# Patient Record
Sex: Female | Born: 1980
Health system: Southern US, Community
[De-identification: ages and names within clinical notes are randomized; demographics above are authoritative.]

## PROBLEM LIST (undated history)

## (undated) DIAGNOSIS — O24419 Gestational diabetes mellitus in pregnancy, unspecified control: Secondary | ICD-10-CM

## (undated) HISTORY — PX: APPENDECTOMY: SHX54

---

## 2007-01-01 ENCOUNTER — Inpatient Hospital Stay (HOSPITAL_COMMUNITY): Admission: EM | Admit: 2007-01-01 | Discharge: 2007-01-02 | Payer: Self-pay | Admitting: Emergency Medicine

## 2007-01-01 ENCOUNTER — Encounter (INDEPENDENT_AMBULATORY_CARE_PROVIDER_SITE_OTHER): Payer: Self-pay | Admitting: Surgery

## 2007-10-16 ENCOUNTER — Emergency Department (HOSPITAL_COMMUNITY): Admission: EM | Admit: 2007-10-16 | Discharge: 2007-10-16 | Payer: Self-pay | Admitting: Emergency Medicine

## 2009-04-18 IMAGING — US US OB LIMITED
1 series · 13 of 28 positions shown · non-contrast
Comparison: None.

CLINICAL DATA: 27-year-old female with abdominal pain and vaginal
bleeding.  Gestational age by last menstrual period is 6 weeks and
4 days.

OBSTETRIC <14 WK ULTRASOUND
TECHNIQUE: Transabdominal and transvaginal ultrasound was
performed for evaluation of the gestation as well as the maternal
uterus and adnexal regions.

[Series 1: unknown · 0.28mm/px · 13 of 33 slices shown]
[im 2/33]
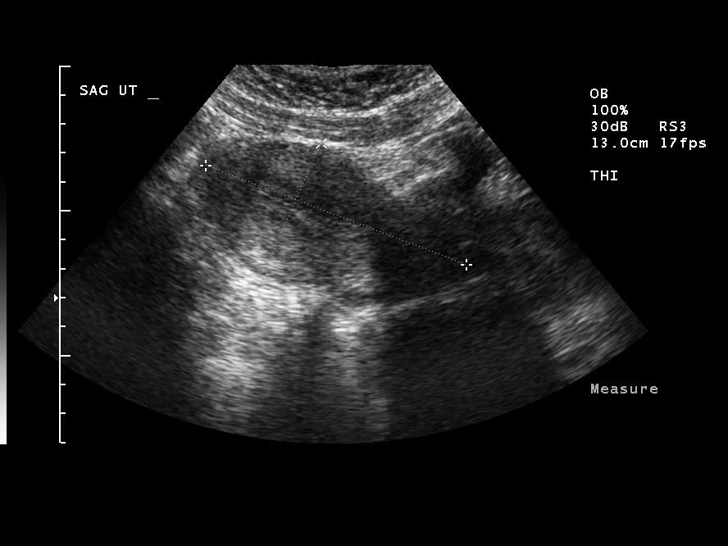
[im 4/33]
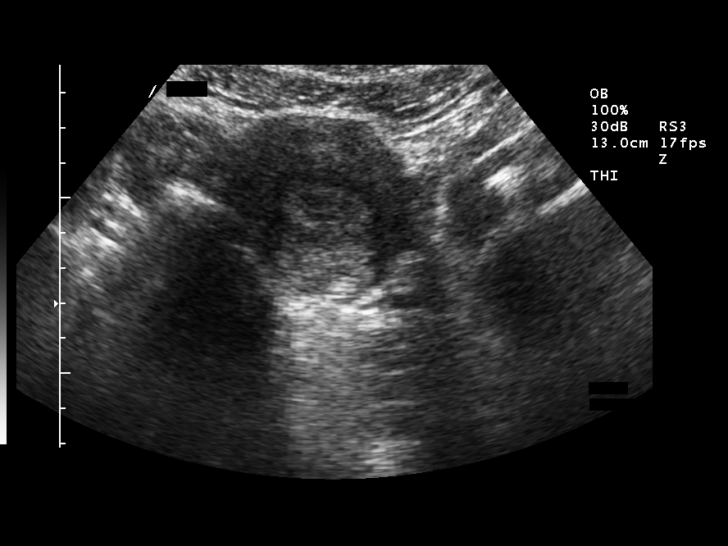
[im 6/33]
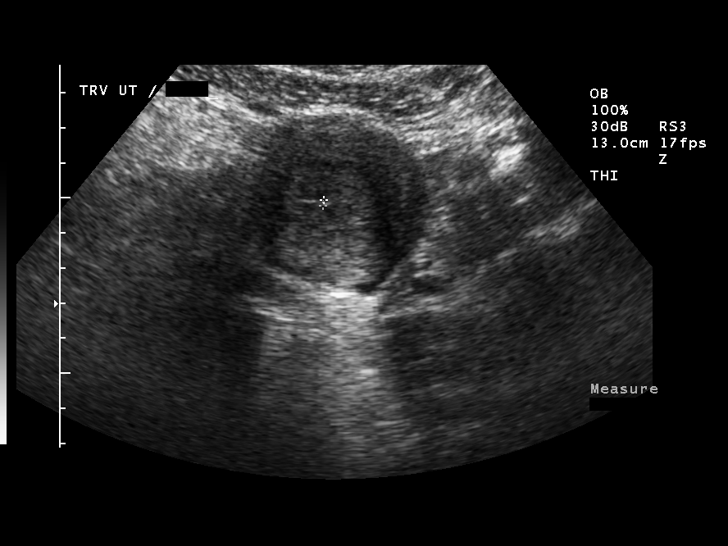
[im 9/33]
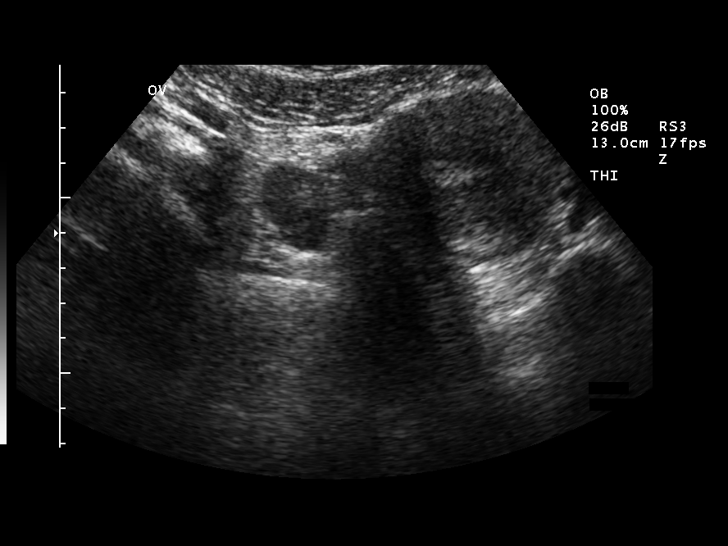
[im 11/33]
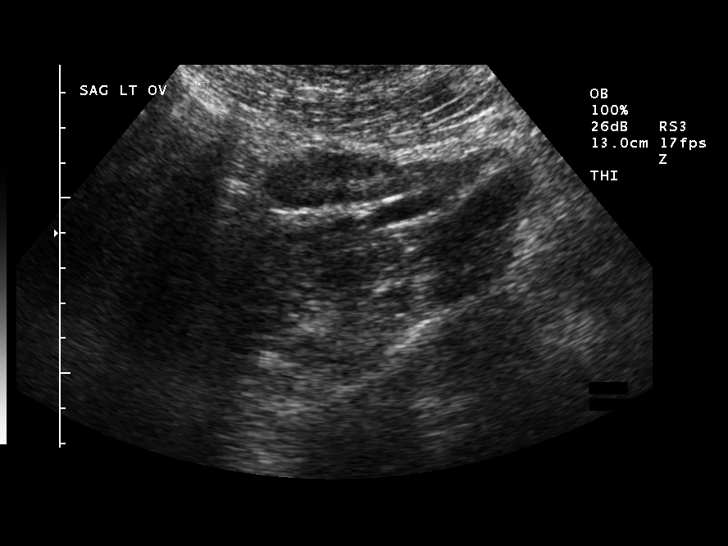
[im 14/33]
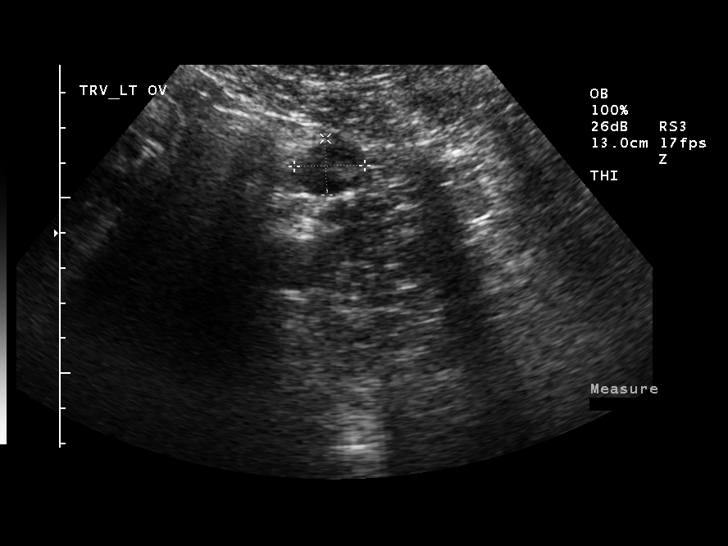
[im 17/33]
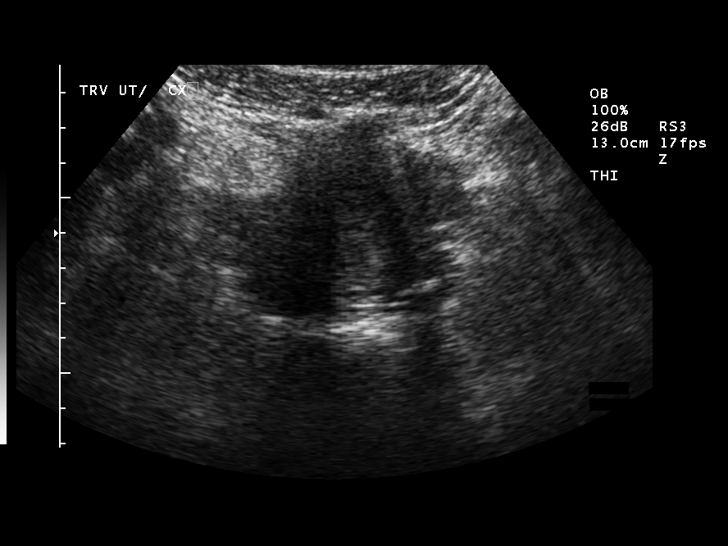
[im 19/33]
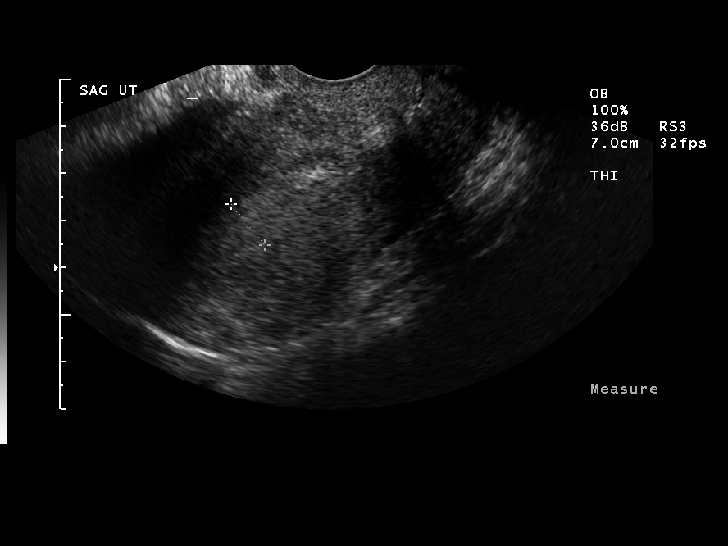
[im 22/33]
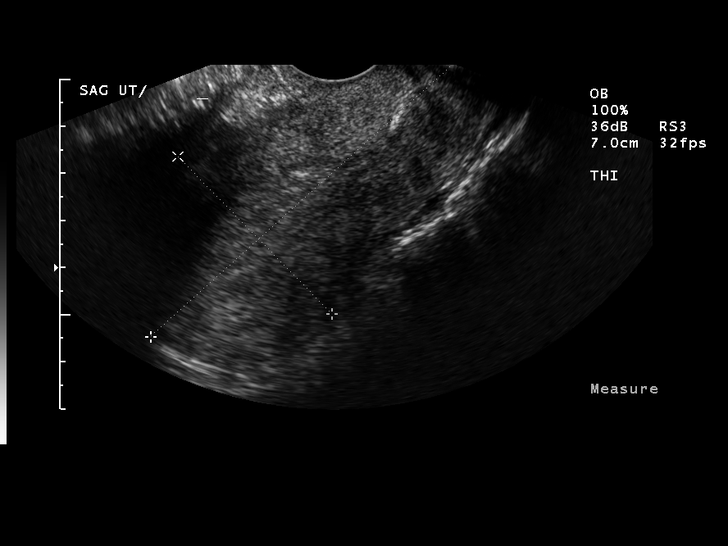
[im 24/33]
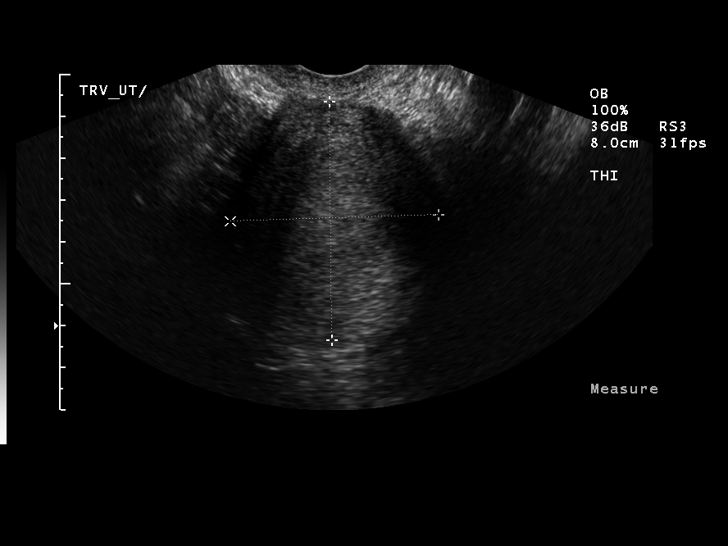
[im 27/33]
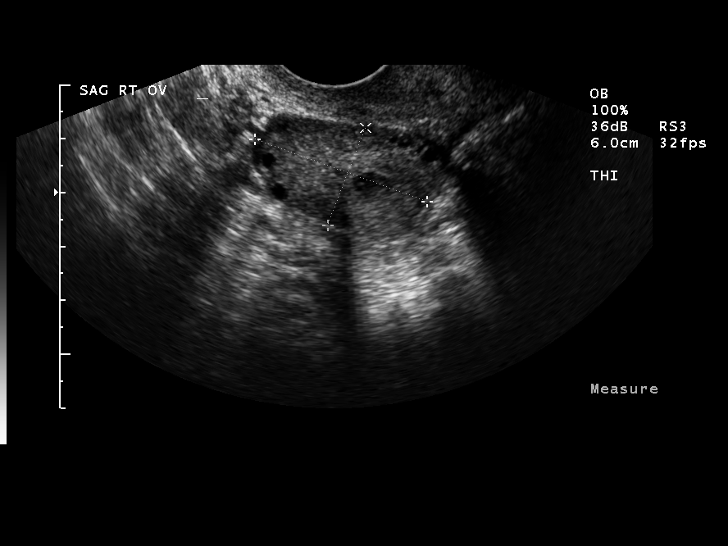
[im 29/33]
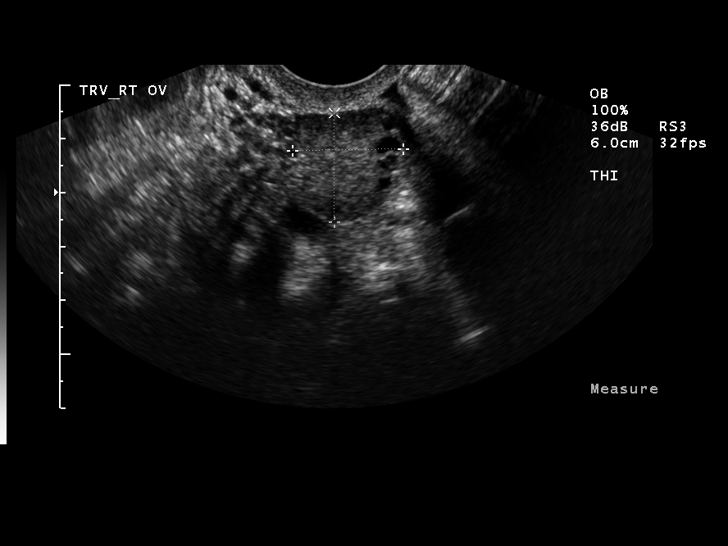
[im 31/33]
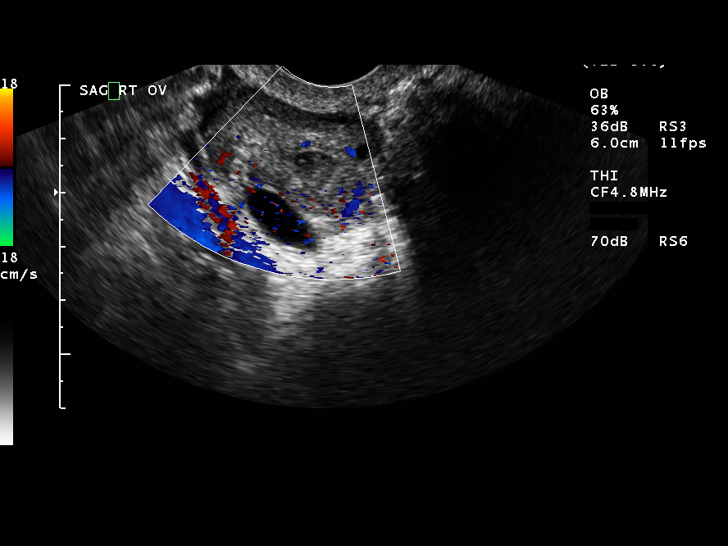

[13 of 28 positions shown; findings below may reference images not displayed]

Intrauterine gestational sac: None is seen.
Yolk sac: None is seen.
Embryo: None is seen.
Cardiac Activity: None is seen.

Maternal uterus/adnexae:
The uterus measures 9.6 x 5.2 x 5.1 cm.  The endometrial stripe
varies from 2.5 - 9 mm in the region of the fundus.  No
intrauterine fluid or gestational sac is identified.  No pelvic
free fluid is identified.  The right adnexa is normal measuring
x 2.1 x 2.0 cm.  The left adnexa was superiorly positioned, too
high for evaluation with transvaginal imaging.  On transabdominal
technique, the left ovary is normal measuring 4.2 by 2.0 x 0.6 cm
and appears within normal limits.  No adnexal mass is identified.
IMPRESSION: 1.  No intrauterine gestation identified.  No suspicious adnexal
mass or pelvic free fluid.
2.  Differential considerations include spontaneous completed
abortion versus occult ectopic pregnancy.   Recommend correlation
with serial quantitative BHCG and followup imaging as indicated.

## 2010-01-26 ENCOUNTER — Ambulatory Visit (HOSPITAL_COMMUNITY)
Admission: RE | Admit: 2010-01-26 | Discharge: 2010-01-26 | Payer: Self-pay | Source: Home / Self Care | Admitting: Family Medicine

## 2010-03-30 ENCOUNTER — Ambulatory Visit (HOSPITAL_COMMUNITY)
Admission: RE | Admit: 2010-03-30 | Discharge: 2010-03-30 | Payer: Self-pay | Source: Home / Self Care | Admitting: Family Medicine

## 2010-06-10 ENCOUNTER — Inpatient Hospital Stay (HOSPITAL_COMMUNITY)
Admission: AD | Admit: 2010-06-10 | Discharge: 2010-06-12 | Payer: Self-pay | Source: Home / Self Care | Attending: Obstetrics and Gynecology | Admitting: Obstetrics and Gynecology

## 2010-09-15 LAB — CBC
HCT: 27.1 % — ABNORMAL LOW (ref 36.0–46.0)
Hemoglobin: 9.4 g/dL — ABNORMAL LOW (ref 12.0–15.0)
MCH: 31.4 pg (ref 26.0–34.0)
MCHC: 33.5 g/dL (ref 30.0–36.0)
MCHC: 34.7 g/dL (ref 30.0–36.0)
MCV: 90.5 fL (ref 78.0–100.0)
Platelets: 162 K/uL (ref 150–400)
RBC: 2.99 MIL/uL — ABNORMAL LOW (ref 3.87–5.11)
RBC: 4.45 MIL/uL (ref 3.87–5.11)
RDW: 14.7 % (ref 11.5–15.5)
RDW: 15 % (ref 11.5–15.5)
WBC: 14.5 K/uL — ABNORMAL HIGH (ref 4.0–10.5)

## 2010-09-15 LAB — SYPHILIS: RPR W/REFLEX TO RPR TITER AND TREPONEMAL ANTIBODIES, TRADITIONAL SCREENING AND DIAGNOSIS ALGORITHM: RPR Ser Ql: NONREACTIVE

## 2010-11-17 NOTE — Op Note (Signed)
NAMEAviva Thomas            ACCOUNT NO.:  [id number]   MEDICAL RECORD NO.:  [id number]          PATIENT TYPE:  INP   LOCATION:  0101                         FACILITY:  [facility]   PHYSICIAN:  Sandria Bales. Ezzard Standing, M.D.  DATE OF BIRTH:  July 24, 1980   DATE OF PROCEDURE:  01/01/2007  DATE OF DISCHARGE:                               OPERATIVE REPORT   PREOPERATIVE DIAGNOSIS:  Acute appendicitis.   POSTOPERATIVE DIAGNOSIS:  Acute purulent appendicitis.   OPERATION PERFORMED:  Laparoscopic appendectomy.   SURGEON:  Sandria Bales. Ezzard Standing, M.D.   ASSISTANT:  None.   ANESTHESIA:  General endotracheal.   ESTIMATED BLOOD LOSS:  Minimal.   INDICATIONS FOR PROCEDURE:  [name] is a 30 year old Hispanic  female from [place] who comes with signs and symptoms of acute  appendicitis.  She speaks no [place], but through a friend who  translated, I described with her the indications, potential  complications of surgery.  Potential complications include but are not  limited to bleeding, infection, bowel injury and possibility of open  surgery.   DESCRIPTION OF PROCEDURE:  Time out was held to identify the patient and  procedure.  The patient was given 1 g of Cefoxitin at initiation of  procedure, had a Foley catheter in place.  Her abdomen was prepped with  Betadine solution and sterilely draped.   An infraumbilical incision was made with sharp dissection and carried  down to the abdominal cavity with 0 degree, 12 mm Hasson trocars  inserted into the abdominal cavity.  There was a 5 mm trocar placed in  the right upper quadrant, a 10 mm trocar placed in the left lower  quadrant.  An abdominal exploration revealed right and left lobes of the  liver unremarkable, gallbladder unremarkable, anterior wall of the  stomach unremarkable.  Attention was turned toward the pelvis, on the CT  scan there was a question of something along the right adnexa but I was  able to flip up her right ovary and  examine it.  She has a cyst of her  ovary which appeared benign and nothing in her adnexa or uterus on the  right side.  Her left side was unremarkable.  Her left ovary was  unremarkable.   I then identified her appendix which was posterior and medial to the  cecum and acutely inflamed with purulence.  I was able to take the  mesentery down with a Harmonic scalpel.  I used a vascular load of the  45 mm Ethicon Endo Gia stapler across the base of the appendix.  I then  delivered the appendix with an Endocatch bag and irrigated the abdomen  with about 500 mL of saline.  The staple line in the cecum looked good.   I then removed the trocars in turn. The umbilical port was closed with 0  Vicryl suture.  The skin at each port was closed with a 5-0 Monocryl  suture, painted with tincture of benzoin and steri-stripped.   The patient tolerated the procedure well, was transported to recovery  room in good condition.      Sandria Bales. Ezzard Standing,  M.D.  Electronically Signed     DHN/MEDQ  D:  01/01/2007  T:  01/02/2007  Job:  [id number]

## 2010-11-17 NOTE — H&P (Signed)
NAMEAviva Thomas            ACCOUNT NO.:  [id number]   MEDICAL RECORD NO.:  [id number]          PATIENT TYPE:  INP   LOCATION:  0101                         FACILITY:  [facility]   PHYSICIAN:  Sandria Bales. Ezzard Standing, M.D.  DATE OF BIRTH:  July 17, 1980   DATE OF ADMISSION:  01/01/2007  DATE OF DISCHARGE:                              HISTORY & PHYSICAL   HISTORY OF ILLNESS:  This is a 30 year old Hispanic female, who [place]ils  [place] who has been in this country for about 2 years,  developed abdominal pain over the last 24 hours which is localized to  the right lower quadrant and came to the [facility] Emergency Room  where she had been evaluated.   Dr. Linwood Dibbles saw her, obtained a CT scan which shows appendicitis and I  have been consulted for evaluation of this.   PAST MEDICAL HISTORY:  1. She denies any allergies.  2. She is not on any medicines.  3. She does not have a doctor she sees in the [place].  4. She has never been in the hospital.   REVIEW OF SYSTEMS:  PULMONARY:  Does not smoke cigarettes, no pneumonia  or tuberculosis.  CARDIAC:  No history of heart disease, chest pain or hypertension.  GASTROINTESTINAL:  No history of peptic ulcer disease or liver disease.  UROLOGIC:  No history of kidney stones or kidney infections.   SOCIAL HISTORY:  1. She is from [place]  2. She is not married but has 3 children who actually are living in [place].  3. She does not speak [place].  4. She has a friend in the room, whose name, unfortunately, I did not      get, who acted as her Nurse, learning disability during this interview.   PHYSICAL EXAM:  Her temperature is 98.9, pulse 83, blood pressure  112/73, respirations are 18.  She is a well-nourished, slightly  overweight, Hispanic female alert and cooperative on physical exam.  HEENT:  Unremarkable.  NECK:  Supple without mass, without thyromegaly.  LUNGS:  Clear to auscultation.  HEART:  A regular rate and rhythm  without murmur or rub.  ABDOMEN:  She has decreased bowel sounds.  She is tender with guarding  in the right lower quadrant.  She has no abdominal scars from prior  surgery.  EXTREMITIES:  She has good strength in all 4 extremities.  NEUROLOGICALLY:  Grossly intact.   Labs that I have show a urinalysis which is negative, a urine pregnancy  which is negative, a white blood count of 16,800, hemoglobin of 13.6,  hematocrit 39.2, platelet count 322,000.  Her sodium was 138, potassium  3.9, chloride of 103, CO2 of 27, glucose of 113.  Lipase is 19.   I reviewed her CT scan which shows an enlarged edematous appendix  consistent with appendicitis and she has a questionable right adnexal  mass which I think I will be able to see at the time of laparoscopy.  There was a suggestion of possibly repeating films.   Of note, I think the patient has no  insurance.  They were surprised  about the money this hospitalization and surgery would cost, but I  really think she has no options.  This is life-threatening and needs to  be done.   DIAGNOSIS and PLAN:  1. Appendicitis.  Plan Appendectomy.  I discussed the operation and      potential complications with the patient.  She has a friend who      speaks fluent [place] and acted as the Nurse, learning disability.  Plan for      surgery today.  2. Right adnexal mass.  I should be able to see this area at the time      of surgery.      Sandria Bales. Ezzard Standing, M.D.  Electronically Signed     DHN/MEDQ  D:  01/01/2007  T:  01/01/2007  Job:  [id number]

## 2011-03-30 LAB — BASIC METABOLIC PANEL
BUN: 13
CO2: 27
Chloride: 104
Creatinine, Ser: 0.82
GFR calc Af Amer: >60
Sodium: 139

## 2011-03-30 LAB — URINALYSIS, ROUTINE W REFLEX MICROSCOPIC
Bilirubin Urine: NEGATIVE
Nitrite: NEGATIVE
Protein, ur: 100 — AB
pH: 7

## 2011-03-30 LAB — CBC
HCT: 37
MCV: 81.9

## 2011-03-30 LAB — URINE MICROSCOPIC-ADD ON

## 2011-03-30 LAB — HCG, QUANTITATIVE, PREGNANCY: hCG, Beta Chain, Quant, S: 23 — ABNORMAL HIGH

## 2011-04-21 LAB — WET PREP, GENITAL
Clue Cells Wet Prep HPF POC: NONE SEEN
Trich, Wet Prep: NONE SEEN
Yeast Wet Prep HPF POC: NONE SEEN

## 2011-04-21 LAB — DIFFERENTIAL
Basophils Absolute: 0
Eosinophils Absolute: 0.4
Eosinophils Relative: 3
Monocytes Relative: 4

## 2011-04-21 LAB — GC/CHLAMYDIA PROBE AMP, GENITAL: GC Probe Amp, Genital: NEGATIVE

## 2011-04-21 LAB — URINALYSIS, ROUTINE W REFLEX MICROSCOPIC
Glucose, UA: NEGATIVE
Hgb urine dipstick: NEGATIVE
Nitrite: NEGATIVE
Specific Gravity, Urine: 1.014
pH: 7

## 2011-04-21 LAB — COMPREHENSIVE METABOLIC PANEL
ALT: 35
AST: 24
Albumin: 4
Alkaline Phosphatase: 56
BUN: 12
Potassium: 3.9

## 2011-04-21 LAB — CBC
HCT: 39.2
Hemoglobin: 13.6
MCV: 84.4
WBC: 16.8 — ABNORMAL HIGH

## 2011-04-21 LAB — LIPASE, BLOOD: Lipase: 19

## 2012-10-26 ENCOUNTER — Encounter (HOSPITAL_COMMUNITY): Payer: Self-pay | Admitting: Emergency Medicine

## 2012-10-26 DIAGNOSIS — Y92410 Unspecified street and highway as the place of occurrence of the external cause: Secondary | ICD-10-CM | POA: Insufficient documentation

## 2012-10-26 DIAGNOSIS — Y9389 Activity, other specified: Secondary | ICD-10-CM | POA: Insufficient documentation

## 2012-10-26 DIAGNOSIS — S20219A Contusion of unspecified front wall of thorax, initial encounter: Secondary | ICD-10-CM | POA: Insufficient documentation
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
## 2012-10-27 ENCOUNTER — Emergency Department (HOSPITAL_COMMUNITY): Payer: No Typology Code available for payment source

## 2012-10-27 ENCOUNTER — Emergency Department (HOSPITAL_COMMUNITY)
Admission: EM | Admit: 2012-10-27 | Discharge: 2012-10-27 | Disposition: A | Discharge status: Home / Self Care | Payer: No Typology Code available for payment source | Attending: Emergency Medicine | Admitting: Emergency Medicine

## 2012-10-27 DIAGNOSIS — S20211A Contusion of right front wall of thorax, initial encounter: Secondary | ICD-10-CM

## 2012-10-27 MED ORDER — METHOCARBAMOL 500 MG PO TABS: 1000.0000 mg | ORAL_TABLET | Freq: Four times a day (QID) | ORAL | Status: DC

## 2012-10-27 MED ORDER — TRAMADOL HCL 50 MG PO TABS: 50.0000 mg | ORAL_TABLET | Freq: Four times a day (QID) | ORAL | Status: DC | PRN

## 2012-10-27 MED ORDER — IBUPROFEN 600 MG PO TABS: 600.0000 mg | ORAL_TABLET | Freq: Four times a day (QID) | ORAL | Status: DC | PRN

## 2012-10-27 MED ORDER — HYDROCODONE-ACETAMINOPHEN 5-325 MG PO TABS
1.0000 | ORAL_TABLET | Freq: Once | ORAL | Status: AC
  Administered 2012-10-27: 1 via ORAL
  Filled 2012-10-27: qty 1

## 2012-10-27 NOTE — ED Notes (Signed)
Pt discharged.Vital signs stable and GCS 15 
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
## 2012-10-29 NOTE — ED Provider Notes (Signed)
Medical screening examination/treatment/procedure(s) were performed by non-physician practitioner and as supervising physician I was immediately available for consultation/collaboration.   Roxane Puerto W Geoffrey Mankin, MD 10/29/12 0502 

## 2013-11-21 ENCOUNTER — Other Ambulatory Visit (HOSPITAL_COMMUNITY): Payer: Self-pay | Admitting: Physician Assistant

## 2013-11-21 DIAGNOSIS — O2630 Retained intrauterine contraceptive device in pregnancy, unspecified trimester: Secondary | ICD-10-CM

## 2013-11-21 DIAGNOSIS — O3680X0 Pregnancy with inconclusive fetal viability, not applicable or unspecified: Secondary | ICD-10-CM

## 2013-11-23 ENCOUNTER — Ambulatory Visit (HOSPITAL_COMMUNITY)
Admission: RE | Admit: 2013-11-23 | Discharge: 2013-11-23 | Disposition: A | Discharge status: Home / Self Care | Payer: Medicaid Other | Source: Ambulatory Visit | Attending: Physician Assistant | Admitting: Physician Assistant

## 2013-11-23 DIAGNOSIS — Z3689 Encounter for other specified antenatal screening: Secondary | ICD-10-CM | POA: Insufficient documentation

## 2013-11-23 DIAGNOSIS — O2630 Retained intrauterine contraceptive device in pregnancy, unspecified trimester: Secondary | ICD-10-CM

## 2013-11-23 DIAGNOSIS — Z30431 Encounter for routine checking of intrauterine contraceptive device: Secondary | ICD-10-CM | POA: Insufficient documentation

## 2013-11-23 DIAGNOSIS — O3680X0 Pregnancy with inconclusive fetal viability, not applicable or unspecified: Secondary | ICD-10-CM

## 2014-01-14 ENCOUNTER — Other Ambulatory Visit (HOSPITAL_COMMUNITY): Payer: Self-pay | Admitting: Physician Assistant

## 2014-01-14 DIAGNOSIS — Z3689 Encounter for other specified antenatal screening: Secondary | ICD-10-CM

## 2014-01-14 LAB — OB RESULTS CONSOLE ANTIBODY SCREEN: Antibody Screen: NEGATIVE

## 2014-01-14 LAB — OB RESULTS CONSOLE RPR: RPR: NONREACTIVE

## 2014-01-14 LAB — OB RESULTS CONSOLE GC/CHLAMYDIA
CHLAMYDIA, DNA PROBE: NEGATIVE
Gonorrhea: NEGATIVE

## 2014-01-14 LAB — OB RESULTS CONSOLE ABO/RH: RH TYPE: POSITIVE

## 2014-01-14 LAB — OB RESULTS CONSOLE HEPATITIS B SURFACE ANTIGEN: Hepatitis B Surface Ag: NEGATIVE

## 2014-01-14 LAB — OB RESULTS CONSOLE RUBELLA ANTIBODY, IGM: RUBELLA: IMMUNE

## 2014-01-14 LAB — OB RESULTS CONSOLE HIV ANTIBODY (ROUTINE TESTING): HIV: NONREACTIVE

## 2014-02-26 ENCOUNTER — Other Ambulatory Visit (HOSPITAL_COMMUNITY): Payer: Self-pay | Admitting: Physician Assistant

## 2014-02-26 ENCOUNTER — Ambulatory Visit (HOSPITAL_COMMUNITY)
Admission: RE | Admit: 2014-02-26 | Discharge: 2014-02-26 | Disposition: A | Discharge status: Home / Self Care | Payer: Medicaid Other | Source: Ambulatory Visit | Attending: Physician Assistant | Admitting: Physician Assistant

## 2014-02-26 DIAGNOSIS — Z363 Encounter for antenatal screening for malformations: Secondary | ICD-10-CM

## 2014-02-26 DIAGNOSIS — Z3689 Encounter for other specified antenatal screening: Secondary | ICD-10-CM

## 2014-02-26 DIAGNOSIS — O4410 Placenta previa with hemorrhage, unspecified trimester: Secondary | ICD-10-CM | POA: Insufficient documentation

## 2014-02-26 DIAGNOSIS — Z1389 Encounter for screening for other disorder: Secondary | ICD-10-CM | POA: Insufficient documentation

## 2014-02-26 DIAGNOSIS — O4412 Placenta previa with hemorrhage, second trimester: Secondary | ICD-10-CM

## 2014-03-01 ENCOUNTER — Other Ambulatory Visit (HOSPITAL_COMMUNITY): Payer: Self-pay | Admitting: Physician Assistant

## 2014-03-01 DIAGNOSIS — Z0372 Encounter for suspected placental problem ruled out: Secondary | ICD-10-CM

## 2014-04-24 ENCOUNTER — Ambulatory Visit (HOSPITAL_COMMUNITY)
Admission: RE | Admit: 2014-04-24 | Discharge: 2014-04-24 | Disposition: A | Discharge status: Home / Self Care | Payer: No Typology Code available for payment source | Source: Ambulatory Visit | Attending: Physician Assistant | Admitting: Physician Assistant

## 2014-04-24 DIAGNOSIS — O4403 Placenta previa specified as without hemorrhage, third trimester: Secondary | ICD-10-CM | POA: Insufficient documentation

## 2014-04-24 DIAGNOSIS — Z0372 Encounter for suspected placental problem ruled out: Secondary | ICD-10-CM

## 2014-04-24 DIAGNOSIS — O4440 Low lying placenta NOS or without hemorrhage, unspecified trimester: Secondary | ICD-10-CM

## 2014-04-24 DIAGNOSIS — Z3A28 28 weeks gestation of pregnancy: Secondary | ICD-10-CM | POA: Insufficient documentation

## 2014-06-24 LAB — OB RESULTS CONSOLE GBS: STREP GROUP B AG: NEGATIVE

## 2014-07-05 NOTE — L&D Delivery Note (Signed)
Mother tolerated delivery well. Has hx of previous hemorrhage. Patient underwent involuntary pushing throughout, which sped up laboring. Baby girl delivered. Shoulders tight through delivery. No complications. Patient given 0.2mg  methergine due to hx. No tears.  Delivery Note At 10:09 AM a viable female was delivered via Vaginal, Spontaneous Delivery (Presentation: ; Occiput Anterior).  APGAR: 8, 9; weight  .   Placenta status: Intact, Spontaneous.  Cord: 3 vessels with the following complications: None.  Cord pH: pending  Anesthesia: None  Episiotomy: None Lacerations: None Suture Repair: n/a Est. Blood Loss (mL): 400  Mom to postpartum.  Baby to Couplet care / Skin to Skin.  Kathee DeltonMcKeag, Jarah Pember D 07/15/2014, 11:06 AM

## 2014-07-15 ENCOUNTER — Inpatient Hospital Stay (HOSPITAL_COMMUNITY)
Admission: AD | Admit: 2014-07-15 | Discharge: 2014-07-18 | DRG: 774 | Disposition: A | Discharge status: Home / Self Care | Payer: Medicaid Other | Source: Ambulatory Visit | Attending: Family Medicine | Admitting: Family Medicine

## 2014-07-15 ENCOUNTER — Encounter (HOSPITAL_COMMUNITY): Payer: Self-pay | Admitting: Emergency Medicine

## 2014-07-15 DIAGNOSIS — D649 Anemia, unspecified: Secondary | ICD-10-CM | POA: Diagnosis not present

## 2014-07-15 DIAGNOSIS — Z8632 Personal history of gestational diabetes: Secondary | ICD-10-CM

## 2014-07-15 DIAGNOSIS — Z3A39 39 weeks gestation of pregnancy: Secondary | ICD-10-CM | POA: Diagnosis present

## 2014-07-15 DIAGNOSIS — R509 Fever, unspecified: Secondary | ICD-10-CM

## 2014-07-15 DIAGNOSIS — N719 Inflammatory disease of uterus, unspecified: Secondary | ICD-10-CM

## 2014-07-15 DIAGNOSIS — O9902 Anemia complicating childbirth: Secondary | ICD-10-CM | POA: Diagnosis not present

## 2014-07-15 DIAGNOSIS — IMO0001 Reserved for inherently not codable concepts without codable children: Secondary | ICD-10-CM

## 2014-07-15 HISTORY — DX: Gestational diabetes mellitus in pregnancy, unspecified control: O24.419

## 2014-07-15 LAB — CBC
HEMATOCRIT: 36 % (ref 36.0–46.0)
Hemoglobin: 12.1 g/dL (ref 12.0–15.0)
MCH: 28.5 pg (ref 26.0–34.0)
MCHC: 33.6 g/dL (ref 30.0–36.0)
MCV: 84.7 fL (ref 78.0–100.0)
Platelets: 208 10*3/uL (ref 150–400)
RBC: 4.25 MIL/uL (ref 3.87–5.11)
RDW: 15.5 % (ref 11.5–15.5)
WBC: 14 10*3/uL — ABNORMAL HIGH (ref 4.0–10.5)

## 2014-07-15 LAB — ABO/RH: ABO/RH(D): O POS

## 2014-07-15 LAB — GLUCOSE, RANDOM: GLUCOSE: 87 mg/dL (ref 70–99)

## 2014-07-15 MED ORDER — METHYLERGONOVINE MALEATE 0.2 MG/ML IJ SOLN
0.2000 mg | INTRAMUSCULAR | Status: DC | PRN
  Administered 2014-07-15: 0.2 mg via INTRAMUSCULAR

## 2014-07-15 MED ORDER — METHYLERGONOVINE MALEATE 0.2 MG/ML IJ SOLN
INTRAMUSCULAR | Status: AC
  Administered 2014-07-15: 0.2 mg via INTRAMUSCULAR
  Filled 2014-07-15: qty 1

## 2014-07-15 MED ORDER — OXYTOCIN 40 UNITS IN LACTATED RINGERS INFUSION - SIMPLE MED: 62.5000 mL/h | INTRAVENOUS | Status: DC | PRN

## 2014-07-15 MED ORDER — LACTATED RINGERS IV SOLN
INTRAVENOUS | Status: DC
  Administered 2014-07-17 (×2): via INTRAVENOUS
  Administered 2014-07-18: 125 mL/h via INTRAVENOUS

## 2014-07-15 MED ORDER — SIMETHICONE 80 MG PO CHEW: 80.0000 mg | CHEWABLE_TABLET | ORAL | Status: DC | PRN

## 2014-07-15 MED ORDER — PRENATAL MULTIVITAMIN CH
1.0000 | ORAL_TABLET | Freq: Every day | ORAL | Status: DC
  Administered 2014-07-16 – 2014-07-18 (×3): 1 via ORAL
  Filled 2014-07-15 (×3): qty 1

## 2014-07-15 MED ORDER — METHYLERGONOVINE MALEATE 0.2 MG PO TABS: 0.2000 mg | ORAL_TABLET | ORAL | Status: DC | PRN

## 2014-07-15 MED ORDER — ONDANSETRON HCL 4 MG/2ML IJ SOLN: 4.0000 mg | Freq: Four times a day (QID) | INTRAMUSCULAR | Status: DC | PRN

## 2014-07-15 MED ORDER — ONDANSETRON HCL 4 MG PO TABS
4.0000 mg | ORAL_TABLET | ORAL | Status: DC | PRN
Start: 2014-07-15 — End: 2014-07-18

## 2014-07-15 MED ORDER — LACTATED RINGERS IV BOLUS (SEPSIS)
1000.0000 mL | Freq: Once | INTRAVENOUS | Status: AC
  Administered 2014-07-15: 1000 mL via INTRAVENOUS

## 2014-07-15 MED ORDER — LANOLIN HYDROUS EX OINT: TOPICAL_OINTMENT | CUTANEOUS | Status: DC | PRN

## 2014-07-15 MED ORDER — OXYTOCIN BOLUS FROM INFUSION
500.0000 mL | INTRAVENOUS | Status: DC
  Administered 2014-07-15: 500 mL via INTRAVENOUS

## 2014-07-15 MED ORDER — LACTATED RINGERS IV SOLN
500.0000 mL | INTRAVENOUS | Status: DC | PRN
  Administered 2014-07-16: 1000 mL via INTRAVENOUS

## 2014-07-15 MED ORDER — DIBUCAINE 1 % RE OINT: 1.0000 "application " | TOPICAL_OINTMENT | RECTAL | Status: DC | PRN

## 2014-07-15 MED ORDER — OXYCODONE-ACETAMINOPHEN 5-325 MG PO TABS
2.0000 | ORAL_TABLET | ORAL | Status: DC | PRN
  Administered 2014-07-16 (×2): 2 via ORAL
  Administered 2014-07-18: 1 via ORAL
  Filled 2014-07-15 (×2): qty 2

## 2014-07-15 MED ORDER — OXYCODONE-ACETAMINOPHEN 5-325 MG PO TABS
1.0000 | ORAL_TABLET | ORAL | Status: DC | PRN
  Administered 2014-07-15 – 2014-07-17 (×5): 1 via ORAL
  Filled 2014-07-15 (×6): qty 1

## 2014-07-15 MED ORDER — WITCH HAZEL-GLYCERIN EX PADS: 1.0000 "application " | MEDICATED_PAD | CUTANEOUS | Status: DC | PRN

## 2014-07-15 MED ORDER — ONDANSETRON HCL 4 MG/2ML IJ SOLN: 4.0000 mg | INTRAMUSCULAR | Status: DC | PRN

## 2014-07-15 MED ORDER — BENZOCAINE-MENTHOL 20-0.5 % EX AERO
1.0000 | INHALATION_SPRAY | CUTANEOUS | Status: DC | PRN
Start: 2014-07-15 — End: 2014-07-18
  Filled 2014-07-15: qty 56

## 2014-07-15 MED ORDER — FENTANYL CITRATE 0.05 MG/ML IJ SOLN
100.0000 ug | INTRAMUSCULAR | Status: DC | PRN
  Administered 2014-07-15 (×4): 100 ug via INTRAVENOUS
  Filled 2014-07-15 (×4): qty 2

## 2014-07-15 MED ORDER — ACETAMINOPHEN 325 MG PO TABS
650.0000 mg | ORAL_TABLET | ORAL | Status: DC | PRN
  Administered 2014-07-17 (×2): 650 mg via ORAL
  Filled 2014-07-15 (×2): qty 2

## 2014-07-15 MED ORDER — CITRIC ACID-SODIUM CITRATE 334-500 MG/5ML PO SOLN: 30.0000 mL | ORAL | Status: DC | PRN

## 2014-07-15 MED ORDER — FLEET ENEMA 7-19 GM/118ML RE ENEM: 1.0000 | ENEMA | RECTAL | Status: DC | PRN

## 2014-07-15 MED ORDER — IBUPROFEN 600 MG PO TABS
600.0000 mg | ORAL_TABLET | Freq: Four times a day (QID) | ORAL | Status: DC
  Administered 2014-07-15 – 2014-07-18 (×13): 600 mg via ORAL
  Filled 2014-07-15 (×12): qty 1

## 2014-07-15 MED ORDER — LIDOCAINE HCL (PF) 1 % IJ SOLN
30.0000 mL | INTRAMUSCULAR | Status: DC | PRN
  Filled 2014-07-15: qty 30

## 2014-07-15 MED ORDER — TETANUS-DIPHTH-ACELL PERTUSSIS 5-2.5-18.5 LF-MCG/0.5 IM SUSP: 0.5000 mL | Freq: Once | INTRAMUSCULAR | Status: DC

## 2014-07-15 MED ORDER — OXYTOCIN 40 UNITS IN LACTATED RINGERS INFUSION - SIMPLE MED
62.5000 mL/h | INTRAVENOUS | Status: DC
  Filled 2014-07-15: qty 1000

## 2014-07-15 MED ORDER — DIPHENHYDRAMINE HCL 25 MG PO CAPS: 25.0000 mg | ORAL_CAPSULE | Freq: Four times a day (QID) | ORAL | Status: DC | PRN

## 2014-07-15 MED ORDER — SENNOSIDES-DOCUSATE SODIUM 8.6-50 MG PO TABS
2.0000 | ORAL_TABLET | ORAL | Status: DC
  Administered 2014-07-15 – 2014-07-17 (×3): 2 via ORAL
  Filled 2014-07-15 (×3): qty 2

## 2014-07-15 MED ORDER — PHENYLEPHRINE 40 MCG/ML (10ML) SYRINGE FOR IV PUSH (FOR BLOOD PRESSURE SUPPORT)
PREFILLED_SYRINGE | INTRAVENOUS | Status: AC
  Filled 2014-07-15: qty 20

## 2014-07-15 NOTE — Lactation Note (Signed)
This note was copied from the chart of Betty Thomas. Lactation Consultation Note initial visit at 9 hours of age.  Family friend interpreting for mom when she doesn't understand some english.  Mom reports experience with 4 older children and breastfeeding, mom denies any problems.  Mom reports this baby is doing well.  Baby is now asleep swaddled in visitors arms.  Baby had a formula bottle of 5mls earlier today when mom wasn't feeling well.  Discussed benefits of exclusively breastfeeding.  Encouraged mom to try hand expression and spoon feeding before supplementing with formula. [facility] resources given and discussed.  Encouraged to feed with early cues on demand.  Early newborn behavior discussed.  Hand expression demonstrated by mom with colostrum visible.  Mom to call for assist as needed.      Patient Name: Betty Thomas ZOXWR'UToday's Date: 07/15/2014 Reason for consult: Initial assessment   Maternal Data Has patient been taught Hand Expression?: Yes Does the patient have breastfeeding experience prior to this delivery?: Yes  Feeding Feeding Type: Breast Fed  LATCH Score/Interventions                Intervention(s): Breastfeeding basics reviewed     Lactation Tools Discussed/Used     Consult Status Consult Status: Follow-up Date: 07/16/14 Follow-up type: In-patient    Jannifer RodneyShoptaw, Mikaylah Libbey Lynn 07/15/2014, 7:24 PM

## 2014-07-15 NOTE — Progress Notes (Signed)
Patient wanted to use her friend for Interpreting, as per Hospital regulations I was present during questions with Lillia AbedLindsay, RN  [facility] Interpreter.

## 2014-07-15 NOTE — H&P (Signed)
Betty Thomas is a 34 y.o. [id number] at 4271w5d presenting for spontaneous onset of labor.  She began having contractions every 2-3 minutes 2 hours prior to having her best [facility] 5cm. [facility] and found to be 6.5cm dilated. Reports loss of mucous plug but gross loss of fluid.   [facility] Pregnancy significant for failed 1hr GTT (152) with 3 hr (79, 163, 173, 139), obesity, hyperemesis. EFW 75th%ile and low lying placenta resolved on 28 wk U/S. Reports history of PPH after most recent delivery. Per EMR NSVD 06/10/2010 followed by 700cc EBL, given pitocin and cytotec. No transfusion given. She is willing to receive blood products if required.   Pt's friend was spanish interpretor to obtain history after patient declined hospital-provided interpretor.     History  Past Medical History  Diagnosis Date  . Postpartum hemorrhage 2011    G5  . Gestational diabetes     G6, diet controlled   Past Surgical History  Procedure Laterality Date  . Appendectomy     Family History: family history is not on file. Social History:  reports that she has never smoked. She has never used smokeless tobacco. She reports that she does not drink alcohol or use illicit drugs.  ROS In addition to HPI: Constitutional: Negative for fever and chills Respiratory: Negative for dyspnea Cardiovascular: Negative for chest pain or palpitations  Gastrointestinal: Negative for vomiting, diarrhea and constipation Genitourinary: Negative for dysuria and urgency Neurological: Negative for headaches and visual changes  Blood pressure 125/79, pulse 94, temperature 98.6 F (37 C), temperature source Oral, resp. rate 20, height 5\' 3"  (1.6 m), weight 81.647 kg (180 lb), SpO2 100 %. Exam Physical Exam  GENERAL: Well-appearing, well-nourished female in some discomfort with contractions. HEENT: Normocephalic, atraumatic HEART: Normal  rate RESP: Normal effort ABDOMEN: Soft, non-tender, gravid uterus consistent with dates. EXTREMITIES: No edema NEURO: alert and oriented, DTRs 2+  Dilation: 6.5 Effacement (%): 100 Station: -1 Presentation: Vertex Exam by:: Honeycutt, RN  FHT:  Baseline 130 , moderate variability, accelerations present, variable deceleration x1 Toco: Irregular q  2-4 min.   Prenatal labs: ABO, Rh: O/Positive/-- (07/13 0000) Antibody: Negative (07/13 0000) Rubella: Immune (07/13 0000) RPR: Nonreactive (07/13 0000)  HBsAg: Negative (07/13 0000)  HIV: Non-reactive (07/13 0000)  GBS: Negative (12/21 0000)   Assessment/Plan: Betty Thomas is a 34 y.o. G5P4004 at 7071w5d here for spontaneous onset of labor. #Labor: Progressing normally #Pain:  IV pain medications #FWB:  Category I #ID:   GBS neg #MOF:  Breast feeding #MOC: Undecided #Circ:  N/A #Borderline gestational diabetes: Check glucose; most recent EFW 75%ile #History of postpartum hemorrhage: Uterotonics to bedside at delivery, type and cross.   Hazeline JunkerGrunz, Ryan 07/15/2014, 6:04 AM

## 2014-07-15 NOTE — ED Notes (Signed)
Pt is having her baby. Pt is at term

## 2014-07-15 NOTE — Progress Notes (Signed)
Spanish interpreter at bedside.  Patient requests that her friend interpret; interpreter consent signed.

## 2014-07-15 NOTE — ED Notes (Signed)
Bed: WA01 Expected date:  Expected time:  Means of arrival:  Comments: 

## 2014-07-15 NOTE — ED Provider Notes (Signed)
CSN: [id number]     Arrival date & time 07/15/14  0413 History   First MD Initiated Contact with Patient 07/15/14 [phone]     Chief Complaint  Patient presents with  . Birthing      (Consider location/radiation/quality/duration/timing/severity/associated sxs/prior Treatment) HPI  Level 5 Caveat: urgent need for intervention. This is a 34 year old female gravida 5 para 4 who has been having contractions for about the past 3 hours. She reports being at term. The contractions are 1 minute apart and moderate in intensity. Her water has not broken. Her prenatal care is been to the health department. She does not have an OB/GYN.  History reviewed. No pertinent past medical history. History reviewed. No pertinent past surgical history. History reviewed. No pertinent family history. History  Substance Use Topics  . Smoking status: Never Smoker   . Smokeless tobacco: Not on file  . Alcohol Use: No   OB History    Gravida Para Term Preterm AB TAB SAB Ectopic Multiple Living   1              Review of Systems  Unable to perform ROS   Allergies  Review of patient's allergies indicates no known allergies.  Home Medications   Prior to Admission medications   Medication Sig Start Date End Date Taking? Authorizing Provider  ibuprofen (ADVIL,MOTRIN) 200 MG tablet Take 200 mg by mouth every 6 (six) hours as needed for pain.    Historical Provider, MD  ibuprofen (ADVIL,MOTRIN) 600 MG tablet Take 1 tablet (600 mg total) by mouth every 6 (six) hours as needed for pain. 10/27/12   Renne CriglerJoshua Geiple, PA-C  methocarbamol (ROBAXIN) 500 MG tablet Take 2 tablets (1,000 mg total) by mouth 4 (four) times daily. 10/27/12   Renne CriglerJoshua Geiple, PA-C  traMADol (ULTRAM) 50 MG tablet Take 1 tablet (50 mg total) by mouth every 6 (six) hours as needed for pain. 10/27/12   Renne CriglerJoshua Geiple, PA-C   There were no vitals taken for this visit.   Physical Exam  General: Well-developed, well-nourished female in no acute distress;  appearance consistent with age of record HENT: normocephalic; atraumatic Eyes: pupils equal, round and reactive to light; extraocular muscles intact Neck: supple Heart: regular rate and rhythm Lungs: clear to auscultation bilaterally Abdomen: Gravid, consistent with dates; diffuse tenderness Extremities: No deformity; full range of motion; pulses normal GU: Bloody show; cervical dilation about 5 centimeters with 100% effacement Neurologic: Awake, alert and oriented; motor function intact in all extremities and symmetric; no facial droop Skin: Warm and dry Psychiatric: Anxious    ED Course  Procedures (including critical care time)   MDM  4:22 AM Obstetric rapid response nurses at bedside. EMS at bedside ready to transport patient. Dr. Jerrol BananaAnwanyu accepting Ob/Gyn.   Final diagnoses:  Active labor     Hanley SeamenJohn L Eaven Schwager, MD 07/15/14 [phone]

## 2014-07-16 LAB — URINALYSIS, ROUTINE W REFLEX MICROSCOPIC
Bilirubin Urine: NEGATIVE
Glucose, UA: NEGATIVE mg/dL
KETONES UR: NEGATIVE mg/dL
NITRITE: NEGATIVE
PROTEIN: NEGATIVE mg/dL
Specific Gravity, Urine: <1.005 — ABNORMAL LOW (ref 1.005–1.030)
UROBILINOGEN UA: 0.2 mg/dL (ref 0.0–1.0)
pH: 5.5 (ref 5.0–8.0)

## 2014-07-16 LAB — CBC
HCT: 20.8 % — ABNORMAL LOW (ref 36.0–46.0)
HEMATOCRIT: 23 % — AB (ref 36.0–46.0)
Hemoglobin: 7.9 g/dL — ABNORMAL LOW (ref 12.0–15.0)
Hemoglobin: 7.3 g/dL — ABNORMAL LOW (ref 12.0–15.0)
MCH: 29.4 pg (ref 26.0–34.0)
MCH: 29.8 pg (ref 26.0–34.0)
MCHC: 34.3 g/dL (ref 30.0–36.0)
MCHC: 35.1 g/dL (ref 30.0–36.0)
MCV: 85.5 fL (ref 78.0–100.0)
MCV: 84.9 fL (ref 78.0–100.0)
PLATELETS: 166 10*3/uL (ref 150–400)
Platelets: 158 10*3/uL (ref 150–400)
RBC: 2.69 MIL/uL — ABNORMAL LOW (ref 3.87–5.11)
RBC: 2.45 MIL/uL — AB (ref 3.87–5.11)
RDW: 16.1 % — AB (ref 11.5–15.5)
RDW: 16.2 % — AB (ref 11.5–15.5)
WBC: 16.9 10*3/uL — AB (ref 4.0–10.5)
WBC: 17.2 10*3/uL — AB (ref 4.0–10.5)

## 2014-07-16 LAB — URINE MICROSCOPIC-ADD ON

## 2014-07-16 LAB — RPR: RPR Ser Ql: NONREACTIVE

## 2014-07-16 LAB — PREPARE RBC (CROSSMATCH)

## 2014-07-16 LAB — CCBB MATERNAL DONOR DRAW

## 2014-07-16 MED ORDER — LACTATED RINGERS IV BOLUS (SEPSIS)
1000.0000 mL | Freq: Once | INTRAVENOUS | Status: AC
  Administered 2014-07-16: 1000 mL via INTRAVENOUS

## 2014-07-16 MED ORDER — PIPERACILLIN-TAZOBACTAM 3.375 G IVPB
3.3750 g | Freq: Three times a day (TID) | INTRAVENOUS | Status: DC
  Administered 2014-07-16 – 2014-07-17 (×3): 3.375 g via INTRAVENOUS
  Filled 2014-07-16 (×4): qty 50

## 2014-07-16 MED ORDER — SODIUM CHLORIDE 0.9 % IV SOLN
Freq: Once | INTRAVENOUS | Status: AC
  Administered 2014-07-16: 23:00:00 via INTRAVENOUS

## 2014-07-16 NOTE — Progress Notes (Signed)
I assisted Morrie SheldonAshley, RN with explanation of care plan. Eda H Royal Interpreter.

## 2014-07-16 NOTE — Progress Notes (Signed)
I stopped by to check on patient's needs.  Eda H Royal Interpreter. °

## 2014-07-16 NOTE — Progress Notes (Signed)
Patient started on zosyn per pharmacy for post partum fever after SVD.  Started on 3.375gram IV every 8 hours (4hr infusion.)    Hurley CiscoMendenhall, Leocadio Heal D, Pharm.D.

## 2014-07-16 NOTE — Progress Notes (Signed)
I assisted Research officer, political partyBrenda RN with explanation of care, patient sighn the consent for: 1- Patient will receive  2 units of blood, maybe later 2 more after lab come and check on her. 2- Patient agree and sign the consent to call the nurse everytime before she going to the bathroom, by Orlan LeavensViria Alvarez, Spanish Interpreter.

## 2014-07-16 NOTE — Progress Notes (Signed)
Pt. Started complaining of shivering and feeling very cold around 1015.  Temp was 98.4; md notified of patient's sudden change (felt normal a few minutes before).  New IV placed and CBC drawn; vitals checked at 1045 and temp 101.7, bp 107/60.  MD aware, New orders received; will continue to monitor.  Vivi MartensAshley Jesseka Drinkard RN

## 2014-07-16 NOTE — Progress Notes (Signed)
MD McKeag updated regarding patient's vital signs; will continue to monitor.  No new orders at this time.   Vivi MartensAshley Lupita Rosales RN

## 2014-07-16 NOTE — Progress Notes (Signed)
UR chart review completed.  

## 2014-07-16 NOTE — Progress Notes (Signed)
I assisted FP with questions.  Eda H Royal Interpreter. °

## 2014-07-16 NOTE — Lactation Note (Signed)
This note was copied from the chart of Betty Thomas. Lactation Consultation Note  Patient Name: Betty Thomas UXLKG'MToday's Date: 07/16/2014 Reason for consult: Follow-up assessment   Follow-up consult at 6527 hours old; Infant has breastfed x8 (10-20 min) + formula x3 (5 ml) in past 24 hours; voids-3; stools-4 in past 24 hours.  Mom experienced x4 children (BF last child for 18 months). RN reported mom is wanting to use friend in room as interpreter while friend was here during the day.  Friend interpreted for LC. When LC entered room mom was asleep but baby began crying and showing feeding cues. Mom independently latched infant in cradle hold with wide mouth but infant tucked bottom lip.  LC showed mom how to un-tuck and flange bottom lip; mom reported the latch felt better; reports strong tug with sucking.  Infant came off breast and wanted to go back on but could not find nipple.  LC taught mom cross-cradle hold for latching to give her more control over the latch and then switching back to cradle hold after infant was latched.  RN reports mom has a fever; mom was sweating when sleeping and feeding infant.  Mom expressed concerns over passing fever to infant.  LC did lots of teaching regarding breastmilk's protective benefits through antibodies and antiviruses.  Encouraged mom to continue breastfeeding regardless of how "sick" mom is.   Educated on size of infant's stomach, growth spurts, and cluster feeding.  Encouraged to continue BF to help increase milk supply; educated on supply/ demand. Encouraged mom to call for assistance as needed with feedings.  Friend in room reported dad will be back this afternoon but dad does not speak [place].  Maternal Data    Feeding Feeding Type: Breast Fed  LATCH Score/Interventions Latch: Grasps breast easily, tongue down, lips flanged, rhythmical sucking.  Audible Swallowing: A few with stimulation  Type of Nipple: Everted  at rest and after stimulation  Comfort (Breast/Nipple): Soft / non-tender     Hold (Positioning): No assistance needed to correctly position infant at breast. Intervention(s): Breastfeeding basics reviewed;Support Pillows;Skin to skin  LATCH Score: 9  Lactation Tools Discussed/Used     Consult Status Consult Status: Follow-up Date: 07/17/14 Follow-up type: In-patient    Lendon KaVann, Copelyn Widmer Walker 07/16/2014, 1:43 PM

## 2014-07-16 NOTE — Progress Notes (Signed)
Post Partum Day 1 Subjective: no complaints, up ad lib, voiding and tolerating PO   Ms. Betty Thomas is a 34 y.o. [id number] who delivered a girld at 5759w5d yesterday at 10:09 via VSD. Labor was uncomplicated. Patient reporting dizziness ON with BP's at <100/60 and tachycardia this morning of 117. CBC stable and WNL, no fever. Voiding, tolerating PO intake, ambulating  well. Mild pain well controlled on ibuprofen. No flatus or BM yet.   Objective: Blood pressure 88/43, pulse 117, temperature 98.6 F (37 C), temperature source Oral, resp. rate 16, height 5\' 3"  (1.6 m), weight 81.647 kg (180 lb), SpO2 100 %, currently breastfeeding.  Physical Exam:  General: alert, cooperative, appears stated age and no distress Lochia: appropriate Uterine Fundus: firm Incision: n/a DVT Evaluation: No evidence of DVT seen on physical exam. No cords or calf tenderness. No significant calf/ankle edema.   Recent Labs  07/15/14 0530  HGB 12.1  HCT 36.0    Assessment/Plan: Plan for discharge tomorrow, Breastfeeding and Contraception : nexplanon. Pt of [facility].   Today: monitor BPs, possibly orthostatics.    LOS: 1 day   Betty Thomas, Marlana Salvagenna M 07/16/2014, 7:52 AM

## 2014-07-16 NOTE — Progress Notes (Signed)
Post Partum Day 1 Subjective:  Betty Thomas is a 34 y.o. [id number] 5467w5d s/p SVD with methergine, EBL 400cc.  +nasal congestion, no cough, +burns with urination (only since delivery).  Had left flank pain earlier which has since resolved.   Objective: Blood pressure 104/70, pulse 130, temperature 98.8 F (37.1 C), temperature source Oral, resp. rate 20, height 5\' 3"  (1.6 m), weight 176 lb (79.833 kg), SpO2 100 %, unknown if currently breastfeeding.  Physical Exam:  General: alert, cooperative and no distress Lochia:normal flow Chest: CTAB Heart: RRR no m/r/g Abdomen: +BS, soft, nontender,  Uterine Fundus: firm, nontender DVT Evaluation: No evidence of DVT seen on physical exam. Extremities: no edema   Recent Labs  07/16/14 1055 07/16/14 1900  HGB 7.9* 7.3*  HCT 23.0* 20.8*    Assessment/Plan:  ASSESSMENT: Betty Thomas is a 34 y.o. [id number] 6367w5d s/p SVD now with suspected endometritis  Febrile overnight but team not notified, when again developed fever earlier today at noon was started on Zosyn.  Received 3L boluses over course of day 2/2 low blood pressure - UA - blood cultures drawn prior to abx - transfuse 2 units, transfer to AICU for close monitoring - monitor ins and outs/urine output - flu swab, although suspect negative - no other signs of infection   LOS: 1 day   Betty Thomas 07/16/2014, 11:26 PM

## 2014-07-16 NOTE — Progress Notes (Signed)
I stopped by to check on patients need, by Orlan LeavensViria Alvarez, Interpreter

## 2014-07-17 ENCOUNTER — Inpatient Hospital Stay (HOSPITAL_COMMUNITY): Payer: Medicaid Other

## 2014-07-17 LAB — CBC
HCT: 26.5 % — ABNORMAL LOW (ref 36.0–46.0)
HEMOGLOBIN: 9.2 g/dL — AB (ref 12.0–15.0)
MCH: 29.1 pg (ref 26.0–34.0)
MCHC: 34.7 g/dL (ref 30.0–36.0)
MCV: 83.9 fL (ref 78.0–100.0)
PLATELETS: 151 10*3/uL (ref 150–400)
RBC: 3.16 MIL/uL — AB (ref 3.87–5.11)
RDW: 15.4 % (ref 11.5–15.5)
WBC: 17.9 10*3/uL — AB (ref 4.0–10.5)

## 2014-07-17 LAB — INFLUENZA PANEL BY PCR (TYPE A & B)
H1N1 flu by pcr: NOT DETECTED
INFLBPCR: NEGATIVE
Influenza A By PCR: NEGATIVE

## 2014-07-17 LAB — MRSA PCR SCREENING: MRSA by PCR: NEGATIVE

## 2014-07-17 LAB — PREPARE RBC (CROSSMATCH)

## 2014-07-17 LAB — CORTISOL: CORTISOL PLASMA: 30.6 ug/dL

## 2014-07-17 MED ORDER — LACTATED RINGERS IV BOLUS (SEPSIS)
500.0000 mL | Freq: Once | INTRAVENOUS | Status: AC
  Administered 2014-07-17: 500 mL via INTRAVENOUS

## 2014-07-17 MED ORDER — SODIUM CHLORIDE 0.9 % IV SOLN
500.0000 mg | Freq: Three times a day (TID) | INTRAVENOUS | Status: AC
  Administered 2014-07-17 – 2014-07-18 (×5): 500 mg via INTRAVENOUS
  Filled 2014-07-17 (×6): qty 500

## 2014-07-17 NOTE — Progress Notes (Signed)
Post Partum Day 2 Subjective: complains of headache.frontal and around eyes with eye movement. no vision changes.  Objective: Blood pressure 96/71, pulse 104, temperature 98.7 F (37.1 C), temperature source Oral, resp. rate 18, height 5\' 3"  (1.6 m), weight 79.833 kg (176 lb), SpO2 98 %, unknown if currently breastfeeding. Pt has had temp to 102.o at 3 pm. Will keep in unit til the a.m. And then transfer. Physical Exam:  General: alert Lochia: appropriate Uterine Fundus: nontender Incision: n/a DVT Evaluation: No evidence of DVT seen on physical exam.   Recent Labs  07/16/14 1900 07/17/14 0735  HGB 7.3* 9.2*  HCT 20.8* 26.5*    Assessment/Plan: Postpartum endometritis Anemia s/p transfusion x 2 units Persistent fevers, now on Primaxin. Plan: continue IV abx til afebrile x 24 hr then d/c home.  transfer out of [facility] in the a.m.   LOS: 2 days   Betty Thomas V 07/17/2014, 10:07 PM

## 2014-07-17 NOTE — Progress Notes (Signed)
UR chart review completed.  

## 2014-07-17 NOTE — Progress Notes (Signed)
I was present during Xray's to assist the patient with any questions. Eda H Royal Interpreter.

## 2014-07-17 NOTE — Progress Notes (Signed)
This patient chose her friend to be her Interpreter. I assisted Natalie,RN with explanation of care plan because the patient's friend does not have any knowledge at all about Medical Terminology and we needed to make sure the patient understood her care plan and what was going on with her. Eda H Royal Interpreter.

## 2014-07-17 NOTE — Lactation Note (Signed)
This note was copied from the chart of Betty Thomas. Lactation Consultation Note  Reviewed basics with mom.  Breasts are becoming full this AM.  Encouraged to call with concerns/assist prn.  Patient Name: Betty Thomas WUJWJ'XToday's Date: 07/17/2014     Maternal Data    Feeding Feeding Type: Breast Fed  LATCH Score/Interventions Latch: Grasps breast easily, tongue down, lips flanged, rhythmical sucking.  Audible Swallowing: A few with stimulation  Type of Nipple: Everted at rest and after stimulation  Comfort (Breast/Nipple): Soft / non-tender     Hold (Positioning): Assistance needed to correctly position infant at breast and maintain latch. Intervention(s): Support Pillows;Breastfeeding basics reviewed  LATCH Score: 8  Lactation Tools Discussed/Used     Consult Status      Huston FoleyMOULDEN, Bensyn Bornemann S 07/17/2014, 11:04 AM

## 2014-07-17 NOTE — Progress Notes (Signed)
Patient's friend is extremely rude and hostile towards me because providers are using me to communicate with the patient.  Eda H Royal  Interpreter.

## 2014-07-17 NOTE — Progress Notes (Signed)
I assisted the Pediatrician with explanation of care plan for the Baby. Eda H Royal  Interpreter.

## 2014-07-18 LAB — CBC WITH DIFFERENTIAL/PLATELET
Basophils Absolute: 0 10*3/uL (ref 0.0–0.1)
Basophils Relative: 0 % (ref 0–1)
EOS ABS: 0 10*3/uL (ref 0.0–0.7)
Eosinophils Relative: 0 % (ref 0–5)
HEMATOCRIT: 25.4 % — AB (ref 36.0–46.0)
HEMOGLOBIN: 8.8 g/dL — AB (ref 12.0–15.0)
LYMPHS ABS: 2.4 10*3/uL (ref 0.7–4.0)
Lymphocytes Relative: 17 % (ref 12–46)
MCH: 29.2 pg (ref 26.0–34.0)
MCHC: 34.6 g/dL (ref 30.0–36.0)
MCV: 84.4 fL (ref 78.0–100.0)
MONO ABS: 0.9 10*3/uL (ref 0.1–1.0)
Monocytes Relative: 7 % (ref 3–12)
Neutro Abs: 10.6 10*3/uL — ABNORMAL HIGH (ref 1.7–7.7)
Neutrophils Relative %: 76 % (ref 43–77)
PLATELETS: 158 10*3/uL (ref 150–400)
RBC: 3.01 MIL/uL — AB (ref 3.87–5.11)
RDW: 16 % — ABNORMAL HIGH (ref 11.5–15.5)
WBC: 13.9 10*3/uL — ABNORMAL HIGH (ref 4.0–10.5)

## 2014-07-18 LAB — TYPE AND SCREEN
ABO/RH(D): O POS
ANTIBODY SCREEN: NEGATIVE
Unit division: 0
Unit division: 0

## 2014-07-18 MED ORDER — SODIUM CHLORIDE 0.9 % IJ SOLN: 3.0000 mL | INTRAMUSCULAR | Status: DC | PRN

## 2014-07-18 MED ORDER — SODIUM CHLORIDE 0.9 % IJ SOLN
3.0000 mL | Freq: Two times a day (BID) | INTRAMUSCULAR | Status: DC
  Administered 2014-07-18: 3 mL via INTRAVENOUS

## 2014-07-18 MED ORDER — LACTATED RINGERS IV SOLN: INTRAVENOUS | Status: DC

## 2014-07-18 NOTE — Discharge Summary (Signed)
Obstetric Discharge Summary Reason for Admission: onset of labor Prenatal Procedures: none Intrapartum Procedures: spontaneous vaginal delivery Postpartum Procedures: transfusion 2 units PRBC's and antibiotics Complications-Operative and Postpartum: none   Hospital Course: Admitted in labor.  Uncomplicated SVD. Noted to be anemic with Hgb 7.9 down from 12.1 and received 2 u PRBC's. Developed fever to 102 and placed on Abx immediately pp, initially Zosyn and then switched to Primaxin. Negative w/u including urine, blood cultures, neg x 2, flu neg and CXR. Afebrile x 24 hours and deemed stable for discharge. HEMOGLOBIN  Date Value Ref Range Status  07/18/2014 8.8* 12.0 - 15.0 g/dL Final   HCT  Date Value Ref Range Status  07/18/2014 25.4* 36.0 - 46.0 % Final    Physical Exam:  General: alert, cooperative and appears stated age 88Lochia: appropriate Uterine Fundus: firm NT DVT Evaluation: No evidence of DVT seen on physical exam.  Discharge Diagnoses: Term Pregnancy-delivered and pp endometritis  Discharge Information: Date: 07/18/2014 Activity: pelvic rest Diet: routine Medications: PNV and Ibuprofen Condition: stable Instructions: see AVS Discharge to: home Follow-up Information    Follow up with [facility] [place] In 6 weeks.   Why:  pp check   Contact information:   [address] [place] [id number] [phone]      Newborn Data: Live born female  Birth Weight: 9 lb 7.9 oz (4305 g) APGAR: 8, 9  Home with mother.  Jerald Villalona S 07/18/2014, 3:45 PM

## 2014-07-18 NOTE — Discharge Instructions (Signed)
Lactancia materna (Breastfeeding) Decidir Economist es una de las mejores elecciones que puede hacer por usted y su beb. El cambio hormonal durante el Media planner produce el desarrollo del tejido mamario y [place] la cantidad y el tamao de los conductos galactforos. Estas hormonas tambin permiten que las protenas, los azcares y las grasas de la sangre produzcan la Northeast Utilities materna en las glndulas productoras de [place]. Las hormonas impiden que la leche materna sea liberada antes del nacimiento del beb, adems de impulsar el flujo de leche luego del nacimiento. Una vez que ha comenzado a Economist, Freight forwarder beb, as Therapist, occupational succin o Social research officer, government, pueden estimular la liberacin de [place] de las glndulas productoras de [place].  LOS BENEFICIOS DE AMAMANTAR Para el beb  La primera leche (calostro) ayuda a Garment/textile technologist funcionamiento del sistema digestivo del beb.  La leche tiene anticuerpos que ayudan a Chemical engineer las infecciones en el beb.  El beb tiene una menor incidencia de asma, alergias y del sndrome de muerte sbita del lactante.  Los nutrientes en la [place] materna son mejores para el beb que la [place] maternizada y estn preparados exclusivamente para cubrir las necesidades del beb.  La leche materna mejora el desarrollo cerebral del beb.  Es menos probable que el beb desarrolle otras enfermedades, como obesidad infantil, asma o diabetes mellitus de tipo 2. Para usted   La lactancia materna favorece el desarrollo de un vnculo muy especial entre la madre y el beb.  Es conveniente. La leche materna siempre est disponible a la Tree surgeon y es [place].  La lactancia materna ayuda a quemar caloras y a perder el peso ganado durante el [place].  Favorece la contraccin del tero al tamao que tena antes del embarazo de manera ms rpida y disminuye el sangrado (loquios) despus del parto.  La lactancia materna contribuye a reducir Catering manager de desarrollar diabetes  mellitus de tipo 2, osteoporosis o cncer de mama o de ovario en el futuro. SIGNOS DE QUE EL BEB EST HAMBRIENTO Primeros signos de hambre  Aumenta su estado de [place].  Se estira.  Mueve la cabeza de un lado a otro.  Mueve la cabeza y abre la boca cuando se le toca la mejilla o la comisura de la boca (reflejo de bsqueda).  [place] vocalizaciones, tales como sonidos de succin, se relame los labios, emite arrullos, suspiros, o chirridos.  Mueve la [employer] boca.  Se chupa con ganas los dedos o las manos. Signos tardos de Hartford Financial.  Llora de manera intermitente. Signos de BJ[employer] Wholesale signos de hambre extrema requerirn que lo calme y lo consuele antes de que el beb pueda alimentarse adecuadamente. No espere a que se manifiesten los siguientes signos de hambre extrema para comenzar a Economist:   Air cabin crew.  Llanto intenso y fuerte.   Gritos. INFORMACIN BSICA SOBRE LA LACTANCIA MATERNA Iniciacin de la lactancia materna  Encuentre un lugar cmodo para sentarse o acostarse, con un buen respaldo para el cuello y la espalda.  Coloque una almohada o una manta enrollada debajo del beb para acomodarlo a la altura de la mama (si est sentada). Las almohadas para Economist se han diseado especialmente a fin de servir de apoyo para los brazos y el beb Kellogg.  Asegrese de que el abdomen del beb est frente al suyo.  Masajee suavemente la mama. Con las yemas de los dedos, masajee la pared del pecho hacia el pezn en un movimiento circular.  Esto estimula el flujo de [place]. Es posible que Engineer, manufacturing systems este movimiento mientras amamanta si la leche fluye lentamente.  Sostenga la mama con el pulgar por arriba del pezn y los otros 4 dedos por debajo de la mama. Asegrese de que los dedos se encuentren lejos del pezn y de la boca del beb.  Empuje suavemente los labios del beb con el pezn o con el dedo.  Cuando la boca del  beb se abra lo suficiente, acrquelo rpidamente a la mama e introduzca todo el pezn y la zona oscura que lo rodea (areola), tanto como sea posible, dentro de la boca del beb.  Debe haber ms areola visible por arriba del labio superior del beb que por debajo del labio inferior.  La lengua del beb debe estar entre la enca inferior y la [place].  Asegrese de que la boca del beb est en la posicin correcta alrededor del pezn (prendida). Los labios del beb deben crear un sello sobre la mama y estar doblados hacia afuera (invertidos).  Es comn que el beb succione durante 2 a 3 minutos para que comience el flujo de [place]. Cmo debe prenderse Es muy importante que le ensee al beb cmo prenderse adecuadamente a la mama. Si el beb no se prende adecuadamente, puede causarle dolor en el pezn y reducir la produccin de [place], y hacer que el beb tenga un escaso aumento de [place]. Adems, si el beb no se prende adecuadamente al pezn, puede tragar aire durante la alimentacin. Esto puede causarle molestias al beb. Hacer eructar al beb al Pilar Plate de mama puede ayudarlo a liberar el aire. Sin embargo, ensearle al beb cmo prenderse a la mama adecuadamente es la mejor manera de evitar que se sienta molesto por tragar Oceanographer se alimenta. Signos de que el beb se ha prendido adecuadamente al pezn:   Payton Doughty o succiona de modo silencioso, sin causarle dolor.  Se escucha que traga cada 3 o 4 succiones.   Hay movimientos musculares por arriba y por delante de sus odos al Printmaker. Signos de que el beb no se ha prendido Audiological scientist al pezn:   Hace ruidos de succin o de chasquido mientras se alimenta.  Siente dolor en el pezn. Si cree que el beb no se prendi correctamente, deslice el dedo en la comisura de la boca y Ameren Corporation las encas del beb para interrumpir la succin. Intente comenzar a amamantar nuevamente. Signos de Fish farm manager Signos  del beb:   Disminuye gradualmente el nmero de succiones o cesa la succin por completo.  Se duerme.  Relaja el cuerpo.  Retiene una pequea cantidad de [facility] boca.  Se desprende solo del pecho. Signos que presenta usted:  Las mamas han aumentado la firmeza, el peso y el tamao 1 a 3 horas despus de Museum/gallery exhibitions officer.  Estn ms blandas inmediatamente despus de amamantar.  Un aumento del volumen de [place], y tambin un cambio en su consistencia y color se producen hacia el quinto da de Tour manager.  Los pezones no duelen, ni estn agrietados ni sangran. Signos de que su beb recibe la cantidad de leche suficiente  Moja al menos 3 paales en 24 horas. La orina debe ser clara y de color amarillo plido a los [address]  [address] de [place].  Defeca al menos 3 veces en 24 horas a los 5 [address]  [address] de [address] La materia fecal debe ser blanda y [place].  Defeca al menos 3 veces en 24 horas a  los 7 das de vida. La materia fecal debe ser grumosa y [place].  No registra una prdida de peso mayor del 10% del peso al nacer durante los primeros [place].  Aumenta de peso un promedio de 4 a 7onzas (113 a 198g) por semana despus de los [place].  Aumenta de [place], [place], de [place] uniforme a Proofreader de los 5 das de vida, sin Museum/gallery curator prdida de peso despus de las 2semanas de vida. Despus de alimentarse, es posible que el beb regurgite una pequea cantidad. Esto es frecuente. FRECUENCIA Y DURACIN DE LA LACTANCIA MATERNA El amamantamiento frecuente la ayudar a producir ms [place] y a Warden/ranger de Social research officer, government en los pezones e hinchazn en las [place]. Alimente al beb cuando muestre signos de hambre o si siente la necesidad de reducir la congestin de las [place]. Esto se denomina "lactancia a demanda". Evite el uso del chupete mientras trabaja para establecer la lactancia (las primeras 4 a 6 semanas despus del nacimiento del beb). Despus de este perodo, podr ofrecerle un  chupete. Las investigaciones demostraron que el uso del chupete durante el primer ao de vida del beb disminuye el riesgo de desarrollar el sndrome de muerte sbita del lactante (SMSL). Permita que el nio se alimente en cada mama todo lo que desee. Contine amamantando al beb hasta que haya terminado de alimentarse. Cuando el beb se desprende o se queda dormido mientras se est alimentando de la primera mama, ofrzcale la segunda. Debido a que, con frecuencia, los recin [place] [employer] las primeras semanas de vida, es posible que deba despertar al beb para alimentarlo. Los horarios de Writer de un beb a otro. Sin embargo, las siguientes reglas pueden servir como gua para ayudarla a Engineer, materials que el beb se alimenta adecuadamente:  Se puede amamantar a los recin nacidos (bebs de 4 semanas o menos de vida) cada 1 a 3 horas.  No deben transcurrir ms de 3 horas durante el da o 5 horas durante la noche sin que se amamante a los recin nacidos.  Debe amamantar al beb 8 veces como mnimo en un perodo de 24 horas, hasta que comience a introducir slidos en su dieta, a los 6 meses de vida aproximadamente. Mead extraccin y Recruitment consultant de la leche materna le permiten asegurarse de que el beb se alimente exclusivamente de [place], aun en momentos en los que no puede amamantar. Esto tiene especial importancia si debe regresar al [place] en el perodo en que an est amamantando o si no puede estar presente en los momentos en que el beb debe alimentarse. Su asesor en lactancia puede orientarla sobre cunto tiempo es [place].  El sacaleche es un aparato que le permite extraer leche de la mama a un recipiente estril. Luego, la leche materna extrada puede almacenarse en un refrigerador o Pension scheme manager. Algunos sacaleches son Theodore Demark, James Ivanoff otros son elctricos. Consulte a su asesor en lactancia qu tipo ser  ms conveniente para usted. Los sacaleches se pueden comprar; sin embargo, algunos hospitales y grupos de apoyo a la lactancia materna alquilan Production assistant, radio. Un asesor en lactancia puede ensearle cmo extraer OfficeMax Incorporated, en caso de que prefiera no usar un sacaleche.  CMO CUIDAR LAS MAMAS DURANTE LA LACTANCIA MATERNA Los pezones se secan, agrietan y duelen durante la Therapist, nutritional. Las siguientes recomendaciones pueden ayudarla a Theatre manager las [employer] y sanas:  Art therapist usar jabn en los pezones.  Use un sostn de soporte. Aunque no son esenciales, las camisetas sin mangas o los sostenes especiales para Economist estn diseados para acceder fcilmente a las mamas, para Economist sin tener que quitarse todo el sostn o la camiseta. Evite usar sostenes con aro o sostenes muy ajustados.  Seque al aire sus pezones durante 3 a 62minutos despus de amamantar al beb.  Utilice solo apsitos de Chiropodist sostn para Tax adviser las prdidas de [place]. La prdida de un poco de Owens [employer]ning tomas es normal.  Utilice lanolina sobre los pezones luego de Economist. La lanolina ayuda a mantener la humedad normal de la piel. Si [place] lanolina pura, no tiene que lavarse los pezones antes de volver a Research scientist (life sciences) al beb. La lanolina pura no es txica para el beb. Adems, puede extraer Cisco algunas gotas de [place] materna y Community education officer suavemente esa [employer], para que la [place] se seque al aire. Durante las primeras semanas despus de dar a luz, algunas mujeres pueden experimentar hinchazn en las mamas (congestin [place]). La congestin puede hacer que sienta las mamas pesadas, calientes y sensibles al tacto. El pico de la congestin ocurre dentro de los 3 a 5 das despus del [place]. Las siguientes recomendaciones pueden ayudarla a Public house manager la congestin:  Vace por completo las mamas al [place]. Puede aplicar calor hmedo en las mamas  (en la ducha o con toallas hmedas para manos) antes de Economist o extraer Northeast Utilities. Esto aumenta la circulacin y [place] a que la [place]. Si el beb no vaca por completo las mamas cuando lo amamanta, extraiga la [place] restante despus de que haya finalizado.  Use un sostn ajustado (para amamantar o comn) o una camiseta sin mangas durante 1 o 2 das para indicar al cuerpo que disminuya ligeramente la produccin de [place].  Aplique compresas de hielo [employer], a menos que le resulte demasiado incmodo.  Asegrese de que el beb est prendido y se encuentre en la posicin correcta mientras lo alimenta. Si la congestin persiste luego de 48 horas o despus de seguir estas recomendaciones, comunquese con su mdico o un Lobbyist. RECOMENDACIONES GENERALES PARA EL CUIDADO DE LA SALUD DURANTE LA LACTANCIA MATERNA  Consuma alimentos saludables. Alterne comidas y colaciones, y coma 3 de cada una por da. Dado que lo que come Solectron Corporation, es posible que algunas comidas hagan que su beb se vuelva ms irritable de lo habitual. Evite comer este tipo de alimentos si percibe que afectan de manera negativa al beb.  Beba leche, jugos de fruta y agua para Engineer, water su sed (aproximadamente [place]).  Descanse con frecuencia, reljese y tome sus vitaminas prenatales para evitar la fatiga, el estrs y la anemia.  Contine con los autocontroles de la mama.  Evite masticar y fumar tabaco.  Evite el consumo de alcohol y drogas. Algunos medicamentos, que pueden ser perjudiciales para el beb, pueden pasar a travs de la SLM Corporation. Es importante que consulte a su mdico antes de Medical sales representative, incluidos todos los medicamentos recetados y de [place], as como los suplementos vitamnicos y herbales. Puede quedar embarazada durante la lactancia. Si desea controlar la natalidad, consulte a su mdico cules son las opciones ms seguras para el  beb. SOLICITE ATENCIN MDICA SI:   Usted siente que quiere dejar de Economist o se siente frustrada con la lactancia.  Siente dolor en las mamas o en los pezones.  Sus  pezones estn agrietados o sangran.  Sus pechos estn irritados, sensibles o calientes.  Tiene un rea hinchada en cualquiera de las mamas.  Siente escalofros o fiebre.  Tiene nuseas o vmitos.  Presenta una secrecin de otro lquido distinto de la leche materna de los pezones.  Sus mamas no se llenan antes de Museum/gallery exhibitions officer al beb para el quinto da despus del [place].  Se siente triste y deprimida.  El beb est demasiado somnoliento como para comer bien.  El beb tiene problemas para dormir.  Moja menos de 3 paales en 24 horas.  Defeca menos de 3 veces en 24 horas.  La piel del beb o la parte blanca de los ojos se vuelven amarillentas.  El beb no ha aumentado de [place] a los [address] de [place]. SOLICITE ATENCIN MDICA DE INMEDIATO SI:   El beb est muy cansado Retail buyer) y no se quiere despertar para comer.  Le sube la fiebre sin causa. Document Released: 06/21/2005 Document Revised: 06/26/2013 [facility] Patient Information 2015 [place], [place]. This information is not intended to replace advice given to you by your health care provider. Make sure you discuss any questions you have with your health care provider.  Instrucciones para la Time Warner cuidados domiciliarios (Birth, Home Care Instructions for Mom) Despus del alta, puede darse cuenta que an le quedan preguntas por hacer acerca de los cambios corporales, las actividades y los cuidados que siguen en las prximas semanas. La siguiente informacin la ayudar a responder algunas de esas preguntas. ACTIVIDAD  Reinicie sus actividades diarias en el hogar de modo gradual.  Permtase perodos de descanso a lo largo del da; duerma una siesta mientras el beb duerme, cuando le sea posible.  Evite levantar pesos (ms de 5 kg) y los deportes o  trabajos extenuantes.  Si el nacimiento fue por cesrea, debe evitar pasar la aspiradora, subir muchas escaleras y conducir el auto durante 4-6 semanas.  Si le han practicado una operacin cesrea, solicite ayuda para las tareas del hogar hasta que se sienta bien como para Education officer, environmental las actividades usted [place].  Pida consejo a su mdico si quiere Engineer, drilling fsicas seguras despus del parto, especialmente si fue sometido a una operacin cesrea. FLUJO VAGINAL Y RETORNO DEL CICLO MENSTRUAL   El flujo vaginal puede continuar por 4-6 semanas despus del [place].  Generalmente, la cantidad disminuye y el color se hace ms claro.  Si usted est [employer] [place], puede reaparecer flujo de color rojo brillante y aumentar la cantidad.  Acustese, levante los pies, coloque compresas fras en la zona inferior del abdomen, haga reposo, y comunquese con su mdico si moja ms de 1 apsito por hora o elimina cogulos grandes.  El perodo menstrual generalmente retornar a las 6-8 semanas despus del parto.  Si usted amamanta al beb, se normalizar a Glass blower/designer de las [address] luego de que deje de Museum/gallery exhibitions officer. CUIDADOS PERINEALES   Use la botella perineal ("peri-bottle") y [place] la toalla sanitaria cada vez que vaya al bao.  Use papel tis en lugar de papel higinico hasta que se cure la sutura.  Puede tomar baos calientes en la baera (15-20 minutos) cuando lo desee.  Siga usando los apsitos indicados o un aerosol Microbiologist.  Si el profesional lo aprueba, puede Customer service manager en crema sobre la episiotoma para Engineer, materials.  No utilice tampones ni se haga irrigaciones hasta que el sangrado vaginal se haya detenido (alrededor de 4 semanas)  Debe evitar las relaciones sexuales durante al Lowe's Companies  3-4 semanas despus del parto o hasta que el flujo rojo parduzco haya desaparecido completamente.  Higiencese de adelante hacia atrs. CUIDADOS DE LA INCISIN (CORTE EFECTUADO POR EL  CIRUJANO)   Luego de la operacin cesrea, dchese siempre que desee, pero trate de mantener seca la zona de la incisin. INTESTINOS/HEMORROIDES   Beba al menos 6-8 vasos de lquidos descafeinados por da.  Incluya en su dieta granos enteros, frutas y vegetales crudos.  Si las hemorroides son un problema, los baos calientes frecuentes en la baera la [place].  Trate de relajarse cuando deba evacuar el intestino.  Puede tomar laxantes de [employer]. Convrselo con el profesional que la asiste. NUTRICIN  Consuma una dieta bien balanceada que incluya los alimentos bsicos.  No trate de perder peso drsticamente eliminando muchas caloras.  Si est amamantando, beba al menos 8-10 vasos de lquidos descafeinados por da e incremente su ingesta en 600 caloras por da.  Contine tomando las vitaminas como en el perodo prenatal al Reynolds American control post parto o hasta que su mdico le indique suspenderlas. LACTANCIA MATERNA  Si no  est amamantando:   Use un buen sostn de soporte.  Limite la ingesta de lquido durante 1  2 das [address], o segn las indicaciones del mdico, si las mamas se hinchan demasiado.  Evite la estimulacin del pezn y aplique compresas fras (no heladas) en las mamas para sentirse ms confortable.  Evite consumir alcohol y bebidas con cafena.  Podr Chemical engineer un analgsico suave de venta libre si siente United Stationers.  No se recomiendan los medicamentos para disminuir la [employer]. Si est amamantando:   Aliente al beb a mamar si piensa que tiene [place].  Lvese las manos antes de [place].  Higienice sus mamas con agua tibia antes de dar de mamar.  Comience a alimentar al beb entre 8 y [address] veces por [address] 10 a 15 minutos de cada mama para estimular la produccin de [place] y Radio producer al recin nacido.  Evite darle agua o el bibern al recin nacido, excepto que se le indique otra cosa.  Lleve al nio al pediatra a los  3 a 5 [address]  [address] de vida y [employer] Scripts a las 2  3 semanas para Development worker, community su progreso con Runner, broadcasting/film/video.  Comunquese con el pediatra si considera que el beb no gana el peso suficiente o puede estar perdiendo [place]. DEPRESIN POSPARTO  Luego del parto su organismo va a sufrir modificaciones drsticas en el nivel de hormonas. Puede sentir deseos de llorar sin motivo aparente y verse incapaz de enfrentarse a todos los cambios que trae un nuevo beb. Esta es una respuesta normal. Busque apoyo en su pareja o amigos. Tmese el tiempo necesario para adaptarse. Si estos sentimientos persisten luego de varias semanas, contctese con su mdico u otros profesionales que puedan ofrecerle ayuda.  Comunquese con el servicio de emergencias de su localidad, vaya a la sala de emergencias o solicite ayuda de inmediato de un familiar, un amigo, o un vecino si siente que puede llegar a lastimarse usted misma, al beb, o a cualquier Engineer, maintenance (IT).  EJERCICIOS  Comience con los ejercicios de [place] enseguida despus del parto. Puede realizarlos mientras est parada, sentada o [place]. Tense los msculos del estmago y los que rodean el canal de [place]. Mantenga durante algunos segundos y luego reljese. Haga cinco repeticiones cada vez. Realice los ejercicios de [place] como parte de su rutina diaria para Radio producer tono de los msculos que  sostienen la vagina, la vejiga y los intestinos.  AUTO EXAMEN DE MAMA   Haga auto exmenes en el mismo momento cada mes, el da de su eleccin. Craig Staggers.  Debe informar a su mdico cualquier bulto, protuberancia o secrecin.  El mejor momento de Chief Operating Officercontrolar sus mamas, si est amamantado es despus de alimentar bien al beb, cuando los pechos no estn tan llenos. Si el perodo ha comenzado y est amamantando, contrlelos entre el 5 y el 7 da.  Recuerde, las mamas estn normalmente "abultadas" si usted est amamantando, debido a que las clulas que contienen leche estn llenas. Esto es transitorio y no  es un riesgo para la salud INTIMIDAD Y SEXUALIDAD  Los que han sido padres recientemente necesitan adaptarse mutuamente en su intimidad y su sexualidad despus de haber tenido un beb. Trate de pasar tiempo con su pareja, comentando las maneras de ajustarse al [place], a las nuevas rutinas y al modo de Toll Brotherssatisfacer los deseos y necesidades de Romeambos. Puede ser necesario recibir Cablevision Systemsorientacin en los casos problemticos. n.  Si est amamantando o an no ha vuelto a tener el perodo menstrual, puede quedar embarazada. Use algn tipo de anticonceptivo para evitar un nuevo embarazo. Converse con su mdico acerca de los mtodos anticonceptivos disponibles para su situacin SOLICITE ATENCIN MDICA DE Engelhard CorporationNMEDIATO SI:  Aumenta la secrecin en la zona de cesrea, la episiotoma o herida, o la secrecin tiene mal olor.  Los apsitos se empapan con sangre en una hora o menos.  Siente dolor intenso o clicos en el abdomen inferior.  Brett Fairybserva una secrecin vaginal con olor ftido.  Aumento ms que disminucin del dolor en la sutura o hinchazn, enrojecimiento o rigidez en el rea.  Usted tiene una temperatura oral de ms de 38,9 C (102 F) y no puede controlarla con medicamentos.  Dolor y/o enrojecimiento en las pantorrillas.  Nuseas con vmitos durante 12 horas.  Repentino e intenso dolor en el pecho.  Falta de aire.  Miccin dolorosa o con sangre.  Tiene problemas visuales.  Dolor de cabeza intenso.  Una zona de la mama se pone roja y dolorosa y usted Mauritaniatiene fiebre. Puede sentir como sntomas de gripe. Document Released: 06/21/2005 Document Revised: 12/21/2011 [facility] Patient Information 2015 [place], [place]. This information is not intended to replace advice given to you by your health care provider. Make sure you discuss any questions you have with your health care provider.

## 2014-07-18 NOTE — Progress Notes (Signed)
Pt and baby being d/c at this time. Mom via wheelchair. IV was d/c without complications. Encouraged to check room thoroughly for belongings. Expressed verbalization of d/c instructions. Interpreter was used by me, Writernursery nurse, OBGYN, and Pediatrician when d/c patient. Pts family and friend is at bedside and will be driving her home. Sheryn BisonGordon, Malani Lees Warner

## 2014-07-18 NOTE — Progress Notes (Signed)
Post Partum Day 3 s.p svd, then pp endometritis tx with zosyn then primaxin. Subjective: up ad lib, voiding, tolerating PO, + flatus and has significant br engorgement, baby br feeding well  Objective: Blood pressure 99/73, pulse 96, temperature 97.7 F (36.5 C), temperature source Oral, resp. rate 20, height 5\' 3"  (1.6 m), weight 79.399 kg (175 lb 0.7 oz), SpO2 98 %, unknown if currently breastfeeding.  Physical Exam:  General: alert, cooperative and no distress Lochia: appropriate Uterine Fundus: firm Incision: n/a DVT Evaluation: No evidence of DVT seen on physical exam.   Recent Labs  07/17/14 0735 07/18/14 0536  HGB 9.2* 8.8*  HCT 26.5* 25.4*    Assessment/Plan: Transfer to mother baby. D/c antibiotics at 24 hr afebrile 4 pm today. D/c home in am , or late d/c today may be considered.   LOS: 3 days   Betty Thomas V 07/18/2014, 7:54 AM

## 2014-07-18 NOTE — Progress Notes (Signed)
I stopped by patient room to check on her needs, patient was asleep, by Orlan LeavensViria Alvarez Spanish Interpreter

## 2014-07-18 NOTE — Progress Notes (Signed)
I assisted RN with questions about the baby. Betty Thomas  Interpreter.

## 2014-07-18 NOTE — Lactation Note (Signed)
This note was copied from the chart of Betty Thomas. Lactation Consultation Note  Follow up visit made.  [name] breasts are very full and slightly firm.  She states baby prefers the bottle and falls asleep at breast.  Breast massaged and milk flow started with hand expression.  24 mm nipple shield applied to assist with latch and feeding.  Baby latched well and nursed actively with audible gulps and significant softening of breast.  Instructed on use and cleaning of nipple shield.  Instructed to use manual pump to post pump if needed for comfort.  Encouraged mom to feed baby often and use good stimulation and breast massage to enhance feeding.  No questions at present.  Encouraged to call for assist/concerns prn.  Patient Name: Betty Thomas JXBJY'NToday'Thomas Date: 07/18/2014 Reason for consult: Follow-up assessment   Maternal Data    Feeding Feeding Type: Breast Fed Length of feed: 20 min  LATCH Score/Interventions Latch: Grasps breast easily, tongue down, lips flanged, rhythmical sucking. (with 24 mm nipple shield) Intervention(Thomas): Adjust position;Assist with latch;Breast massage;Breast compression  Audible Swallowing: Spontaneous and intermittent Intervention(Thomas): Alternate breast massage;Hand expression  Type of Nipple: Everted at rest and after stimulation  Comfort (Breast/Nipple): Filling, red/small blisters or bruises, mild/mod discomfort Problem noted: Engorgment (MANUAL PUMP) Intervention(Thomas): Hand expression;Other (comment)  Problem noted: Mild/Moderate discomfort Interventions (Mild/moderate discomfort): Hand massage;Hand expression;Pre-pump if needed;Post-pump  Hold (Positioning): No assistance needed to correctly position infant at breast. Intervention(Thomas): Breastfeeding basics reviewed;Support Pillows;Position options  LATCH Score: 9  Lactation Tools Discussed/Used Tools: Nipple Shields Nipple shield size: 24 Initiated by:: RN Date initiated::  07/17/14   Consult Status Consult Status: Follow-up Date: 07/19/14    Huston FoleyMOULDEN, Betty Thomas 07/18/2014, 1:55 PM

## 2014-07-22 LAB — CULTURE, BLOOD (ROUTINE X 2)
CULTURE: NO GROWTH
Culture: NO GROWTH

## 2015-05-27 IMAGING — US US OB COMP LESS 14 WK
1 series · 14 of 28 positions shown · non-contrast
Comparison: None.

CLINICAL DATA: Evaluate viability and check for IUD

EXAM:
OBSTETRIC <14 WK US AND TRANSVAGINAL OB US
TECHNIQUE: Both transabdominal and transvaginal ultrasound examinations were
performed for complete evaluation of the gestation as well as the
maternal uterus, adnexal regions, and pelvic cul-de-sac.
Transvaginal technique was performed to assess early pregnancy.

[Series 1: us ob comp less 14 wks · 61 acquisitions, 14 frames shown]
[im 3/61]
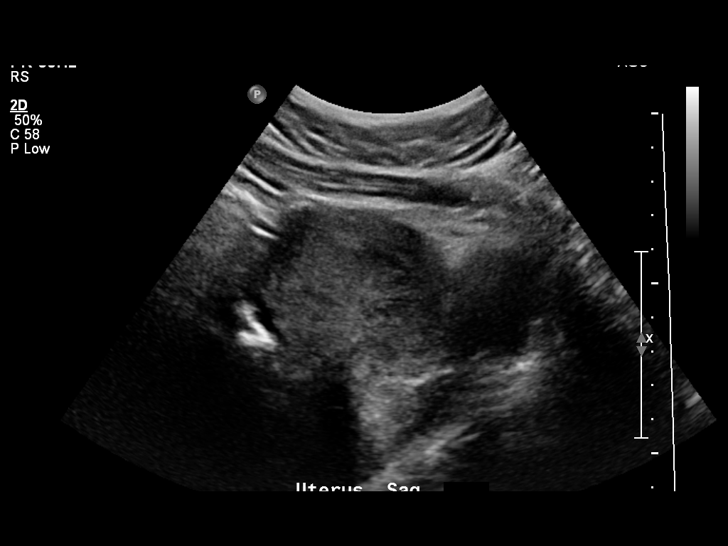
[im 7/61]
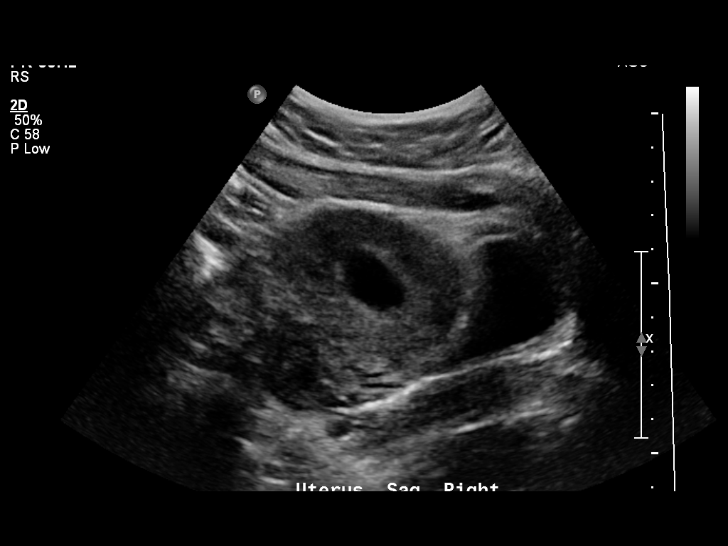
[im 12/61]
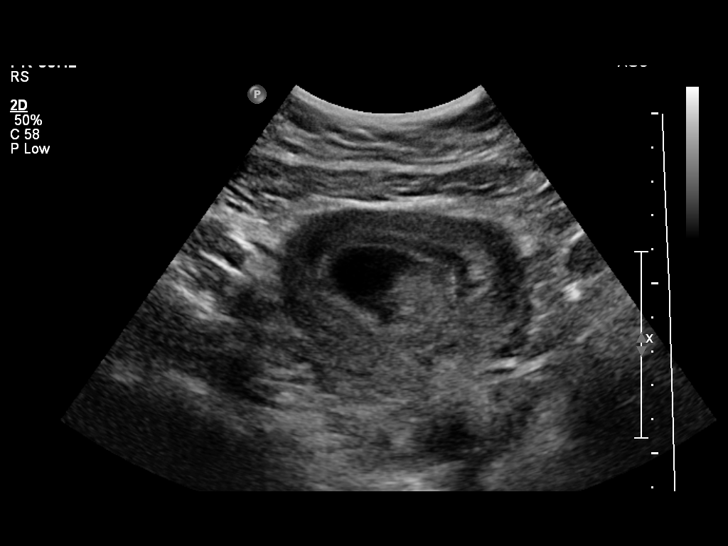
[im 16/61]
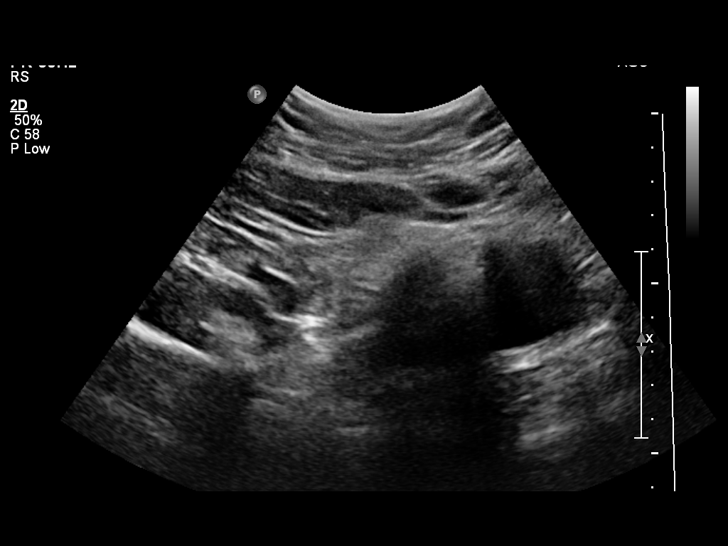
[im 21/61]
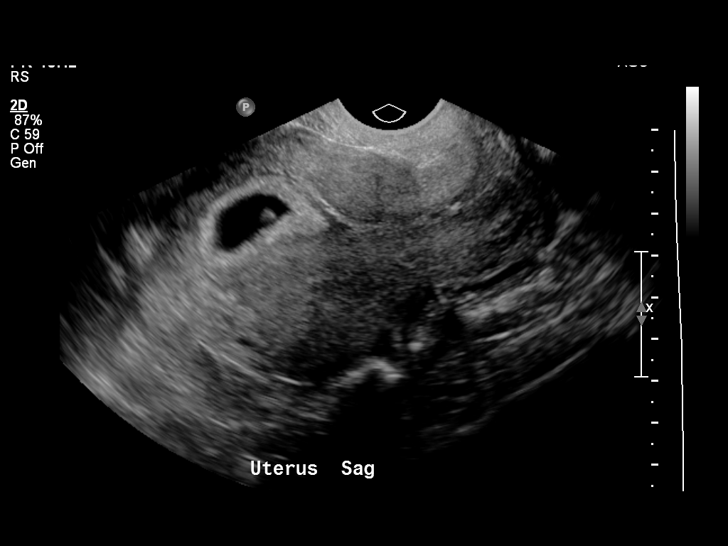
[im 25/61]
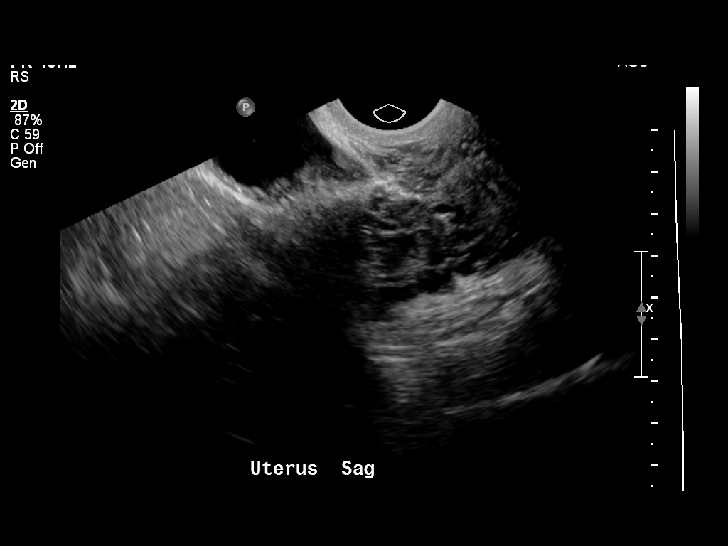
[im 29/61]
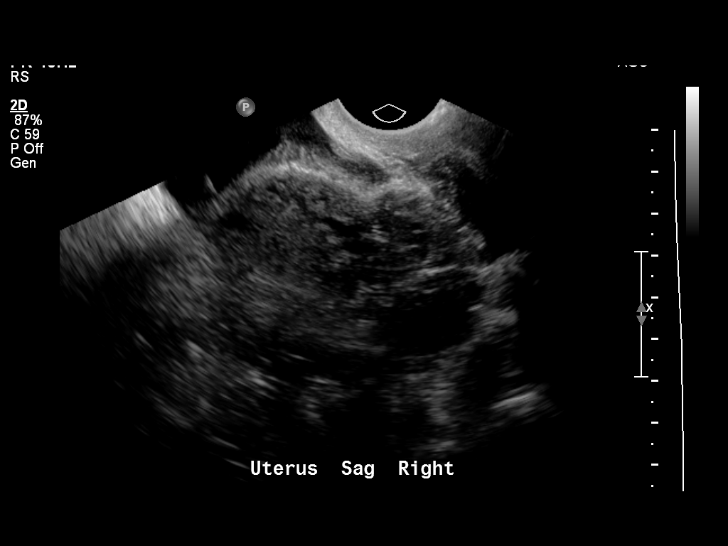
[im 34/61]
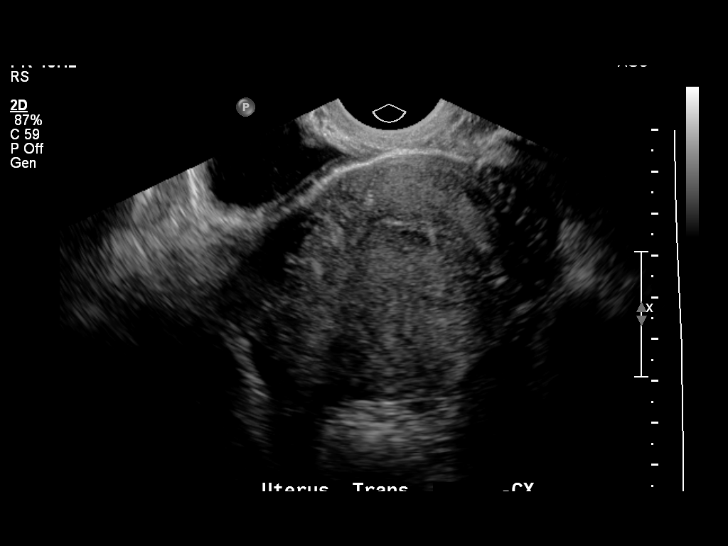
[im 38/61]
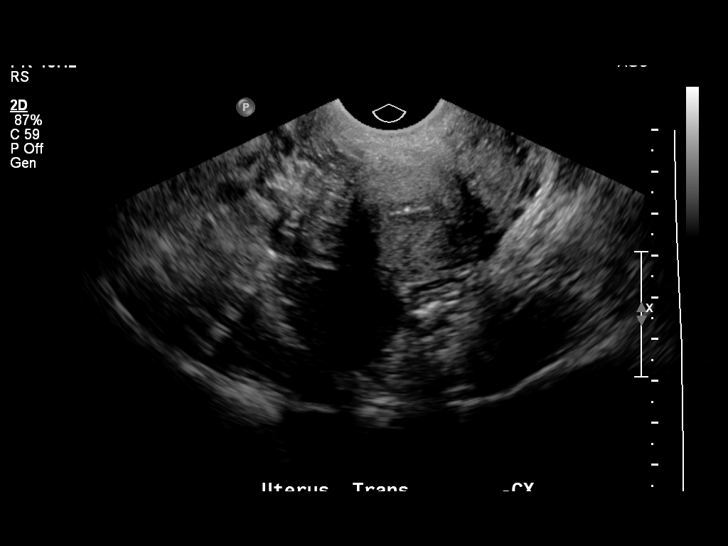
[im 43/61]
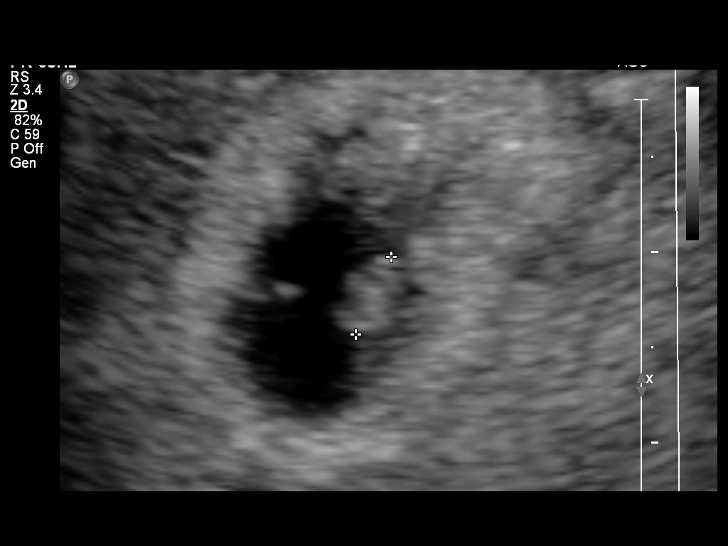
[im 47/61]
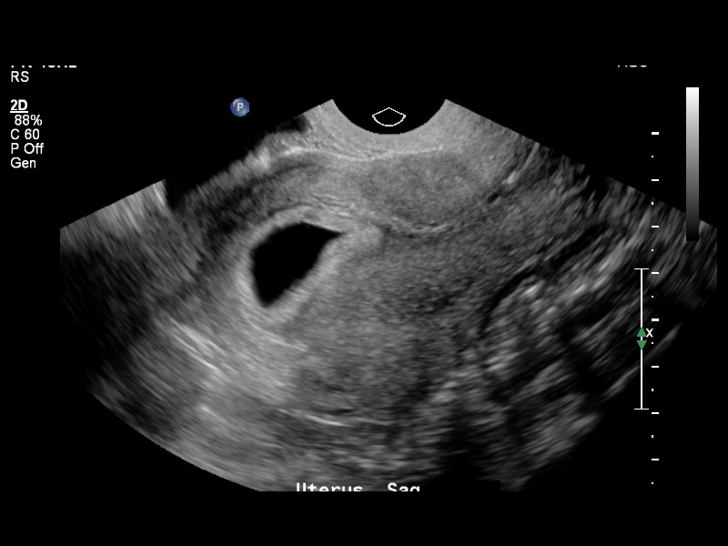
[im 52/61]
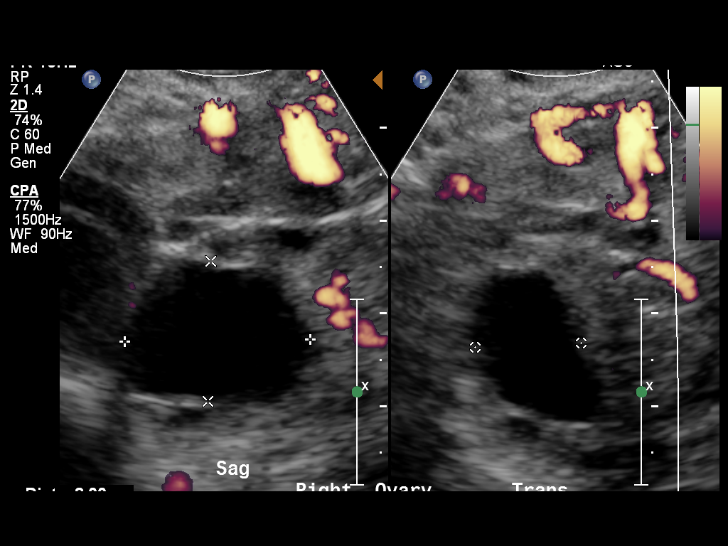
[im 56/61]
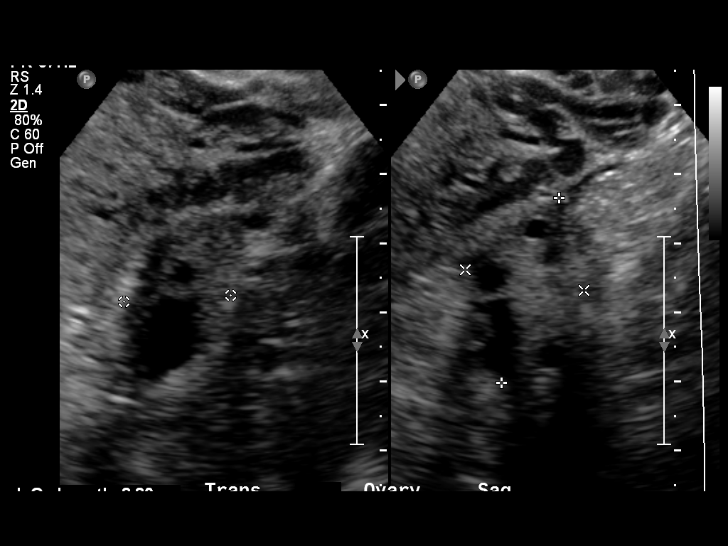
[im 61/61]
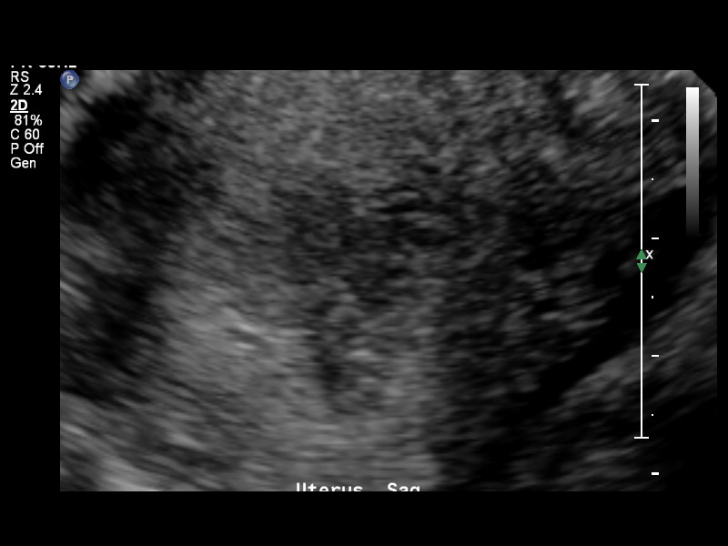

[14 of 28 positions shown; findings below may reference images not displayed]

FINDINGS: Intrauterine gestational sac: Visualized/normal in shape.

Yolk sac:  Yes

Embryo:  Yes

Cardiac Activity: Yes

Heart Rate:  116 bpm

MSD:    mm    w     d

CRL:   46  mm   6 w 2 d                  US EDC: 07/17/2014

Maternal uterus/adnexae:

Subchorionic hemorrhage: Small.  This measures 07/17/2014

Right ovary: Normal

Left ovary: Normal

Other :The IUD is not visualized.

Free fluid:  No free fluid
IMPRESSION: 1. Single living intrauterine gestation. The estimated gestational
age is 6 weeks and 2 days.
2. Small subchorionic hemorrhage.

## 2016-01-18 IMAGING — DX DG CHEST 2V
2 series · 2 of 2 positions shown · non-contrast
Comparison: 10/27/2012

CLINICAL DATA: Postpartum fever.

EXAM:
CHEST  2 VIEW

[chest pa]
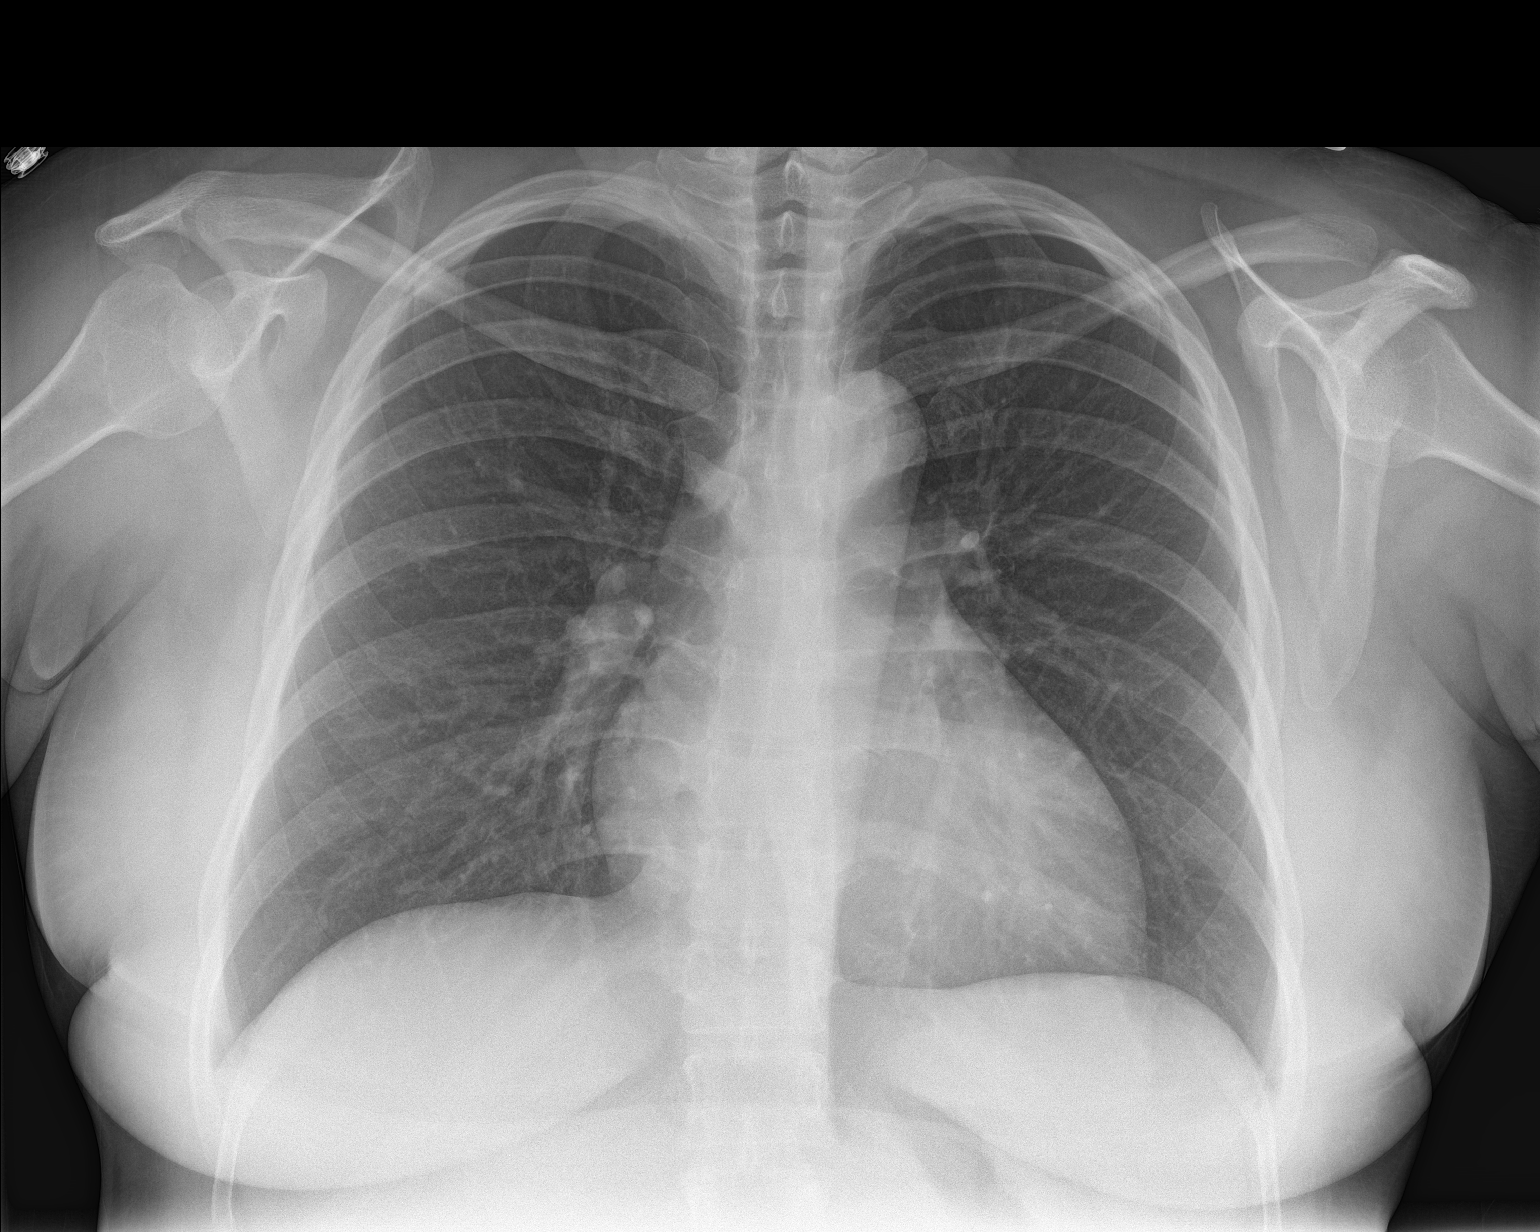

[chest lat]
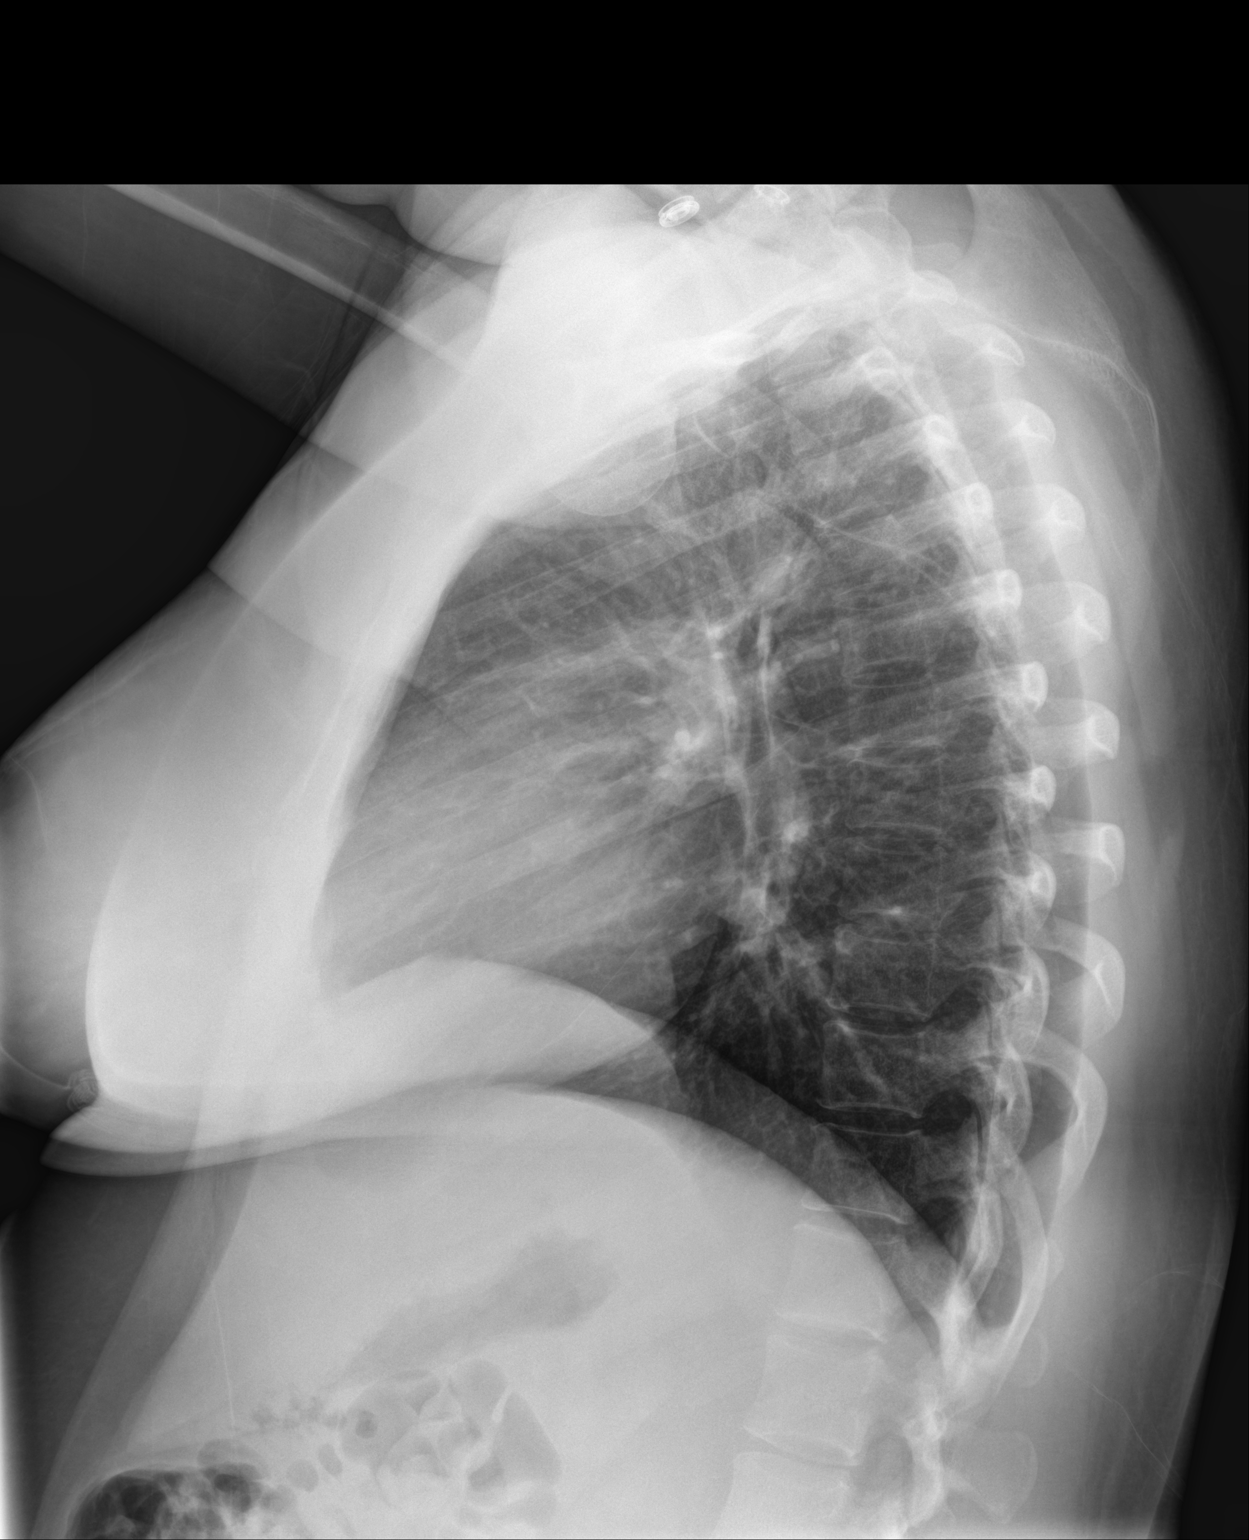

[2 of 2 positions shown; findings below may reference images not displayed]

FINDINGS: The heart size and mediastinal contours are within normal limits.
Both lungs are clear. The visualized skeletal structures are
unremarkable.
IMPRESSION: No active cardiopulmonary disease.

## 2019-04-19 DIAGNOSIS — R739 Hyperglycemia, unspecified: Secondary | ICD-10-CM

## 2019-04-19 LAB — GLUCOSE, POCT (MANUAL RESULT ENTRY): POC Glucose: 380 mg/dl — AB (ref 70–99)

## 2019-04-19 NOTE — Congregational Nurse Program (Signed)
Betty Thomas came by the center for flu shot and was found to have high blood sugar levels during screening.CBG = 380. Health education was provided regarding diabetes in general to include normal levels, diabetic diet and exercise. I have made appointment for to see Dr Joya Gaskins at [place] [facility]. Honor Loh RN BSN PCCN [phone]

## 2019-04-19 NOTE — Congregational Nurse Program (Signed)
  Dept: [facility] Nurse Program Note  Date of Encounter: 04/19/2019  Past Medical History: Past Medical History:  Diagnosis Date  . Gestational diabetes    G6, diet controlled  . Postpartum hemorrhage 2011   G5    Encounter Details: CNP Questionnaire - 04/19/19 1300      Questionnaire   Patient Status  Immigrant    Race  Hispanic or Latino    Location Patient Served At  [employer]  Not Applicable    Uninsured  Uninsured (NEW 1x/quarter)    Food  No food insecurities    Housing/Utilities  Yes, have permanent housing    Transportation  No transportation needs    Interpersonal Safety  Yes, feel physically and emotionally safe where you currently live    Medication  Yes, have medication insecurities    Medical Provider  No    Referrals  Area Agency    ED Visit Averted  Yes    Life-Saving Intervention Made  Not Applicable

## 2019-05-02 ENCOUNTER — Encounter: Payer: Self-pay | Admitting: Critical Care Medicine

## 2019-05-02 ENCOUNTER — Other Ambulatory Visit: Payer: Self-pay | Admitting: Critical Care Medicine

## 2019-05-02 ENCOUNTER — Telehealth: Payer: Self-pay

## 2019-05-02 MED ORDER — METFORMIN HCL 500 MG PO TABS: 500.0000 mg | ORAL_TABLET | Freq: Two times a day (BID) | ORAL | 3 refills | Status: DC

## 2019-05-02 MED FILL — metFORMIN HCL 500 MG TABS: 500 | 30 days supply | Qty: 60 | Fill #0

## 2019-05-02 NOTE — Progress Notes (Signed)
Rx for metformin

## 2019-05-02 NOTE — Telephone Encounter (Signed)
I called [facility] to remind her of appointment today Honor Loh RN BSN PCCN CNP [phone]

## 2019-05-03 NOTE — Progress Notes (Signed)
Patient ID: Betty Thomas, female   DOB: January 16, 1981, 38 y.o.   MRN: [id number]  This is a 38 year old Latina female who is seen in the [place] clinic upon referral from [facility].  The patient has new onset diabetes.  The patient noted during a flu shot encounter with Congregational nurse program 2 weeks ago her CBG was 380.  She notes increased thirst and polyphagia and weight gain.  She has numbness in the feet and some blurred vision.  She is on no medications and does not have a way to monitor her sugars.  She has been in [place] for 15 years from [place].  She had 5 children all healthy.  She did have gestational diabetes with her last pregnancy.  On exam her blood glucose today is 230  The patient is mildly obese female no acute distress chest was clear cardiac exam unremarkable I did a full foot exam she has normal sensation no bunions no evidence of breakdown or infection and good pulses in both feet  Impression is that of type 2 diabetes with obesity  Plan is to begin Metformin 500 mg twice daily via the Congregational nurse medication program and to obtain appointment for this patient to community health and wellness and follow-up so we can establish care and obtain labs

## 2019-05-09 ENCOUNTER — Encounter: Payer: Self-pay | Admitting: Critical Care Medicine

## 2019-05-09 NOTE — Progress Notes (Signed)
Patient ID: Betty Thomas, female   DOB: 1980-12-17, 38 y.o.   MRN: [id number]  This patient came to the [place] clinic for follow-up and had a CBG of 132 which is markedly better than the 200+ CBG from week ago.  She is on the metformin 500 mg twice daily.  She has an established clinic follow-up upcoming in this month.

## 2019-05-19 NOTE — Progress Notes (Signed)
Subjective:    Patient ID: Betty Thomas, female    DOB: October 11, 1980, 38 y.o.   MRN: [id number]  Note this visit was conducted with a Spanish interpreter throughout the entire visit whose name was Betty Thomas via the [employer] interpretation online service  This is a very pleasant 38 year old Latina female who immigrated from [place] 15 years ago but has not had regular primary care follow-up.  The patient has had 5 children all of them are healthy.  She had gestational diabetes with her last pregnancy in 2016.  The patient was seen in our [place] screening clinic on referral from [facility] and found to have an elevated blood glucose of 380.  We started her on oral metformin 5 mg twice daily she comes in today for follow-up.  Note her CBG is elevated at this visit and her hemoglobin A1c is 9.7 at this visit  The patient does not yet have a meter to follow her blood sugar.  She does states she has some difficulty with soreness in her gums.  She denies chest pain shortness of breath cough urgency frequency flank pain abdominal pain nausea vomiting diarrhea or other specific problems.  See review of system below.  There is no prior history of hypertension.  She is not had labs checked recently.  She did have anemia with her last pregnancy and no follow-up hemoglobin is yet to be obtained.    Past Medical History:  Diagnosis Date  . Gestational diabetes    G6, diet controlled  . Postpartum hemorrhage 2011   G5     History reviewed. No pertinent family history.   Social History   Socioeconomic History  . Marital status: Married    Spouse name: Not on file  . Number of children: Not on file  . Years of education: Not on file  . Highest education level: Not on file  Occupational History  . Not on file  Social Needs  . Financial resource strain: Not on file  . Food insecurity    Worry: Not on file    Inability: Not on file  . Transportation needs    Medical:  Not on file    Non-medical: Not on file  Tobacco Use  . Smoking status: Never Smoker  . Smokeless tobacco: Never Used  Substance and Sexual Activity  . Alcohol use: No  . Drug use: No  . Sexual activity: Yes  Lifestyle  . Physical activity    Days per week: Not on file    Minutes per session: Not on file  . Stress: Not on file  Relationships  . Social Herbalist on phone: Not on file    Gets together: Not on file    Attends religious service: Not on file    Active member of club or organization: Not on file    Attends meetings of clubs or organizations: Not on file    Relationship status: Not on file  . Intimate partner violence    Fear of current or ex partner: Not on file    Emotionally abused: Not on file    Physically abused: Not on file    Forced sexual activity: Not on file  Other Topics Concern  . Not on file  Social History Narrative  . Not on file     No Known Allergies   Outpatient Medications Prior to Visit  Medication Sig Dispense Refill  . ibuprofen (ADVIL,MOTRIN) 200 MG tablet Take 200 mg by  mouth every 6 (six) hours as needed for pain.    . metFORMIN (GLUCOPHAGE) 500 MG tablet Take 1 tablet (500 mg total) by mouth 2 (two) times daily with a meal. 60 tablet 3  . Prenatal Vit-Fe Fumarate-FA (PRENATAL MULTIVITAMIN) TABS tablet Take 1 tablet by mouth daily at 12 noon.     No facility-administered medications prior to visit.     Review of Systems Constitutional:   No  weight loss, night sweats,  Fevers, chills, fatigue, lassitude. HEENT:   No headaches,  Difficulty swallowing,  Tooth/dental problems,  Sore throat,                No sneezing, itching, ear ache, nasal congestion, post nasal drip,   CV:  No chest pain,  Orthopnea, PND, swelling in lower extremities, anasarca, dizziness, palpitations  GI  No heartburn, indigestion, abdominal pain, nausea, vomiting, diarrhea, change in bowel habits, loss of appetite  Resp: No shortness of breath  with exertion or at rest.  No excess mucus, no productive cough,  No non-productive cough,  No coughing up of blood.  No change in color of mucus.  No wheezing.  No chest wall deformity  Skin: no rash or lesions.  GU: no dysuria, change in color of urine, no urgency or frequency.  No flank pain.  MS:  No joint pain or swelling.  No decreased range of motion.  No back pain.  Psych:  No change in mood or affect. No depression or anxiety.  No memory loss.     Objective:   Physical Exam Vitals:   05/21/19 0918  BP: 124/85  Pulse: 78  Resp: 18  Temp: 99.2 F (37.3 C)  TempSrc: Oral  SpO2: 100%  Weight: 180 lb (81.6 kg)  Height: 5' 1"  (1.549 m)    Gen: Pleasant, mildly obese , in no distress,  normal affect  ENT: No lesions,  mouth clear,  oropharynx clear, no postnasal drip Poor dentition with evidence of periodontal disease  Neck: No JVD, no TMG, no carotid bruits  Lungs: No use of accessory muscles, no dullness to percussion, clear without rales or rhonchi  Cardiovascular: RRR, heart sounds normal, no murmur or gallops, no peripheral edema  Abdomen: soft and NT, no HSM,  BS normal  Musculoskeletal: No deformities, no cyanosis or clubbing  Neuro: alert, non focal  Skin: Warm, no lesions or rashes  Foot exam is essentially normal however her shoes are extremely tight fitting.  HgbA1C 9.7  CBG 141        Assessment & Plan:  I personally reviewed all images and lab data in the [facility] system as well as any outside material available during this office visit and agree with the  radiology impressions.   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 -     TRUEplus Lancets 28G MISC; Use to measure blood sugar twice a day -     Blood Glucose Monitoring Suppl (TRUE METRIX METER) w/Device KIT; Use to measure blood sugar twice a day -     glucose blood (TRUE METRIX BLOOD GLUCOSE TEST) test strip; Use as instructed   That we did give the patient a Pneumovax 23 valent and tetanus vaccine at this visit  Labs will be ordered today to include a CBC, lipid panel, comprehensive metabolic panel.  We also gave the patient a diabetic diet to review

## 2019-05-21 ENCOUNTER — Encounter: Payer: Self-pay | Admitting: Critical Care Medicine

## 2019-05-21 ENCOUNTER — Ambulatory Visit: Payer: Self-pay | Attending: Critical Care Medicine | Admitting: Critical Care Medicine

## 2019-05-21 ENCOUNTER — Other Ambulatory Visit: Payer: Self-pay

## 2019-05-21 VITALS — BP 124/85 | HR 78 | Temp 99.2°F | Resp 18 | Ht 61.0 in | Wt 180.0 lb

## 2019-05-21 DIAGNOSIS — E119 Type 2 diabetes mellitus without complications: Secondary | ICD-10-CM | POA: Insufficient documentation

## 2019-05-21 DIAGNOSIS — E1165 Type 2 diabetes mellitus with hyperglycemia: Secondary | ICD-10-CM

## 2019-05-21 DIAGNOSIS — E11630 Type 2 diabetes mellitus with periodontal disease: Secondary | ICD-10-CM

## 2019-05-21 LAB — POCT GLYCOSYLATED HEMOGLOBIN (HGB A1C): Hemoglobin A1C: 9.7 % — AB (ref 4.0–5.6)

## 2019-05-21 LAB — GLUCOSE, POCT (MANUAL RESULT ENTRY): POC Glucose: 141 mg/dl — AB (ref 70–99)

## 2019-05-21 MED ORDER — TRUE METRIX BLOOD GLUCOSE TEST VI STRP: ORAL_STRIP | 12 refills | Status: AC

## 2019-05-21 MED ORDER — METFORMIN HCL 1000 MG PO TABS: 1000.0000 mg | ORAL_TABLET | Freq: Two times a day (BID) | ORAL | 3 refills | Status: DC

## 2019-05-21 MED ORDER — TRUE METRIX METER W/DEVICE KIT: PACK | 0 refills | Status: AC

## 2019-05-21 MED ORDER — TRUEPLUS LANCETS 28G MISC: 1 refills | Status: AC

## 2019-05-21 MED FILL — TRUEplus LANCETS 28G MISC: 50 days supply | Qty: 100 | Fill #0

## 2019-05-21 MED FILL — !TRUE METRIX BLOOD GLUCOSE: 1 days supply | Qty: 1 | Fill #0

## 2019-05-21 MED FILL — metFORMIN HCL 1000 MG TABS: 1000 | 30 days supply | Qty: 60 | Fill #0

## 2019-05-21 MED FILL — TRUE METRIX TEST STRIP: 50 days supply | Qty: 100 | Fill #0

## 2019-05-21 NOTE — Assessment & Plan Note (Signed)
Evidence for elevated glucose and uncontrolled diabetes on 500 mg twice daily Metformin  Hemoglobin A1c of 9.7 at this visit  Plan will be to obtain for the patient glucose testing supplies and increase her Metformin to 1000 mg twice daily  I also gave the patient a diabetic diet and she will follow up with our licensed clinical pharmacist for further diabetic education

## 2019-05-21 NOTE — Patient Instructions (Signed)
Increase Metformin to 1000 mg twice daily  Follow diabetic diet as below  Labs today to check your body chemistries complete blood count and cholesterol levels will be obtained  Pneumonia and tetanus vaccines were given today  Appointment with our clinical pharmacist on your diabetes will be made in the next 2 weeks  Return to see Dr. Delford FieldWright 1 month  Testing supplies to check your blood sugars were given  Please see a dentist to have your teeth cleaned I will give you a list of dentists which you can obtain   La diabetes mellitus y el cuidado de los pies Diabetes Mellitus and Foot Care El cuidado de los pies es un aspecto importante de la salud, especialmente si tiene diabetes. La diabetes puede generar problemas debido a que el flujo sanguneo (circulacin) es deficiente en las piernas y los pies, y esto puede hacer que la piel:  Se torne ms fina y Magazine features editorseca.  Se resquebraje ms fcilmente.  Cicatrice ms lentamente.  Se descame y agriete. Tambin pueden estar daados los nervios (neuropata) de las piernas y de los pies, lo que provoca una disminucin de la sensibilidad. En consecuencia, es posible que no advierta heridas pequeas en los pies que pueden causar problemas ms graves. Identificar y tratar cualquier complicacin lo antes posible es la mejor manera de evitar futuros problemas de pie. Cmo cuidar los pies Higiene de los pies  McGraw-HillLvese los pies todos los [address]  [address] con agua tibia y un [place]jabn suave. No use agua caliente. Luego squese los pies y The Krogerentre los dedos dando palmaditas, hasta que estn completamente secos. No remoje los pies, ya que esto puede resecar la piel.  Crtese las uas de los pies en lnea recta. No escarbe debajo de las uas o alrededor General Millsde las cutculas. Lime los bordes de las uas con una lima o esmeril.  Aplique una locin hidratante o vaselina en la piel de los pies y en las uas secas y Panamaquebradizas. Use una locin que no contenga alcohol ni fragancias. No  aplique locin entre los dedos. Zapatos y calcetines  Use calcetines de algodn o medias limpias todos los [place]. Asegrese de que no le PACCAR Incajusten demasiado. No use calcetines que le lleguen a las rodillas, ya que podran disminuir el flujo de sangre a las piernas.  Use zapatos de cuero que le queden bien y que sean acolchados. Revise siempre los zapatos antes de ponerlos para asegurarse de que no haya objetos en su interior.  Para amoldar los zapatos, clcelos solo algunas horas por da. Esto evitar lesiones en los pies. Heridas, rasguos, durezas y callosidades  Controle sus pies diariamente para observar si hay ampollas, cortes, moretones, llagas o enrojecimiento. Si no puede ver la planta del pie, use un espejo o pdale ayuda a Engineer, maintenance (IT)otra persona.  No corte las durezas o callosidades, ni trate de quitarlas con medicamentos.  Si algo le ha raspado, cortado o lastimado la piel de los pies, mantenga la piel de esa zona limpia y Crowellseca. Puede higienizar estas zonas con agua y un jabn suave. No limpie la zona con agua oxigenada, alcohol ni yodo.  Si tiene una herida, un rasguo, una dureza o una callosidad en el pie, revsela varias veces al da para asegurarse de que se est curando y no se infecte. Est atento a los siguientes signos: ? Dolor, hinchazn o enrojecimiento. ? Lquido o sangre. ? Calor. ? Pus o mal olor. Instrucciones generales  No se cruce de piernas. Esto puede disminuir el flujo  de sangre a los pies.  No use bolsas de agua caliente ni almohadillas trmicas en los pies. Podran causar quemaduras. Si ha perdido la sensibilidad en los pies o las piernas, no sabr lo que le est sucediendo hasta que sea demasiado tarde.  Proteja sus pies del calor y del fro con calzado, en la playa o sobre el pavimento caliente.  Programe una cita para un examen completo de los pies por lo menos una vez al ao (anualmente) o con ms frecuencia si tiene [employer]. Si tiene [employer], infrmele al mdico de [employer] cortes, las llagas o los moretones. Comunquese con un mdico si:  Tiene una afeccin que aumenta su riesgo de tener infecciones y tiene cortes, llagas o moretones en los pies.  Tiene una lesin que no se [place].  Tiene una zona irritada en las piernas o los pies.  Siente una sensacin de ardor u hormigueo en las piernas o los pies.  Siente dolor o calambres en las piernas o los pies.  Las piernas o los pies estn adormecidos.  Siente los pies siempre fros.  Siente dolor alrededor de una ua del pie. Solicite ayuda de inmediato si:  Tiene una herida, un rasguo, una dureza o una callosidad en el pie y: ? Scientist, clinical (histocompatibility and immunogenetics), hinchazn o enrojecimiento que empeora. ? Le sale lquido o sangre de la herida, el rasguo, la dureza o la callosidad. ? La herida, el rasguo, la dureza o la callosidad est caliente al tacto. ? Le sale pus o mal olor de la herida, el rasguo, la dureza o la callosidad. ? Tiene fiebre. ? Tiene una lnea roja que sube por la pierna. Resumen  Controle todos los das el estado de sus pies para observar si hay cortes, llagas, manchas rojas, hinchazn o ampollas.  Humctese los pies y las piernas a diario.  Use zapatos de cuero que le queden bien y que sean acolchados.  Si tiene [employer], infrmele al mdico de [employer] cortes, las llagas o los moretones.  Programe una cita para un examen completo de los pies por lo menos una vez al ao (anualmente) o con ms frecuencia si tiene [employer]. Esta informacin no tiene Marine scientist el consejo del mdico. Asegrese de hacerle al mdico cualquier pregunta que tenga. Document Released: 06/21/2005 Document Revised: 02/11/2017 Document Reviewed: 02/11/2017 Elsevier Patient Education  2020 [employer].  Diabetes mellitus y nutricin, en adultos Diabetes Mellitus and Nutrition, Adult Si sufre de diabetes (diabetes mellitus), es  muy importante tener hbitos alimenticios saludables debido a que sus niveles de Designer, television/film set sangre (glucosa) se ven afectados en gran medida por lo que come y bebe. [place] alimentos saludables en las cantidades [place], aproximadamente a la [employer], [place] ayudar a:  Aeronautical engineer glucemia.  Disminuir el riesgo de sufrir una enfermedad cardaca.  Mejorar la presin arterial.  Science writer o mantener un peso saludable. Todas las personas que sufren de diabetes son diferentes y cada una tiene necesidades diferentes en cuanto a un plan de alimentacin. El mdico puede recomendarle que trabaje con un especialista en dietas y nutricin (nutricionista) para Financial trader plan para usted. Su plan de alimentacin puede variar segn factores como:  Las caloras que necesita.  Los medicamentos que toma.  Su peso.  Sus niveles de glucemia, presin arterial y colesterol.  Su nivel de [place].  Otras afecciones que tenga, como enfermedades cardacas  o renales. Cmo me afectan los carbohidratos? Los carbohidratos, o hidratos de carbono, afectan su nivel de glucemia ms que cualquier otro tipo de alimento. La ingesta de carbohidratos naturalmente aumenta la cantidad de [employer]. El recuento de carbohidratos es un mtodo destinado a Midwife un registro de la cantidad de carbohidratos que se consumen. El recuento de carbohidratos es importante para Pharmacologist la glucemia a un nivel saludable, especialmente si utiliza insulina o toma determinados medicamentos por va oral para la diabetes. Es importante conocer la cantidad de carbohidratos que se pueden ingerir en cada comida sin correr Surveyor, minerals. Esto es Government social research officer. Su nutricionista puede ayudarlo a calcular la cantidad de carbohidratos que debe ingerir en cada comida y en cada refrigerio. Entre los alimentos que contienen carbohidratos, se incluyen:  Pan, cereal, arroz, pastas y galletas.  Papas y maz.   Guisantes, frijoles y lentejas.  Leche y Dentist.  Nils Pyle y [place].  Postres, como pasteles, galletas, helado y caramelos. Cmo me afecta el alcohol? El alcohol puede provocar disminuciones sbitas de la glucemia (hipoglucemia), especialmente si utiliza insulina o toma determinados medicamentos por va oral para la diabetes. La hipoglucemia es una afeccin potencialmente mortal. Los sntomas de la hipoglucemia (somnolencia, mareos y confusin) son similares a los sntomas de haber consumido demasiado alcohol. Si el mdico afirma que el alcohol es seguro para usted, [place] estas pautas:  Limite el consumo de alcohol a no ms de por da si es mujer y no est [place], y a si es hombre. Una medida equivale a 12oz ( ) de cerveza, 5oz ( ) de vino o 1oz (48ml) de bebidas alcohlicas de alta graduacin.  No beba con el estmago vaco.  Mantngase hidratado bebiendo agua, refrescos dietticos o t helado sin azcar.  Tenga en cuenta que los refrescos comunes, los jugos y otras bebida para Engineer, manufacturing pueden contener mucha azcar y se deben contar como carbohidratos. Cules son algunos consejos para seguir este plan?  Leer las etiquetas de los alimentos  Comience por leer el tamao de la porcin en la "Informacin nutricional" en las etiquetas de los alimentos envasados y las bebidas. La cantidad de caloras, carbohidratos, grasas y otros nutrientes mencionados en la etiqueta se basan en una porcin del alimento. Muchos alimentos contienen ms de una porcin por envase.  Verifique la cantidad total de gramos (g) de carbohidratos totales en una porcin. Puede calcular la cantidad de porciones de carbohidratos al dividir el total de carbohidratos por 15. Por ejemplo, si un alimento tiene un total de 30g de carbohidratos, equivale a 2 porciones de carbohidratos.  Verifique la cantidad de gramos (g) de grasas saturadas y grasas trans en una porcin. Escoja alimentos que no  contengan grasa o que tengan un bajo contenido.  Verifique la cantidad de miligramos (mg) de sal (sodio) en una porcin. La [employer]ley[employer]son de las personas deben limitar la ingesta de sodio total a menos de 2300mg  por .  Siempre consulte la informacin nutricional de los alimentos etiquetados como "con bajo contenido de grasa" o "sin grasa". Estos alimentos pueden tener un mayor contenido de Futures trader agregada o carbohidratos refinados, y deben evitarse.  Hable con su nutricionista para identificar sus objetivos diarios en cuanto a los nutrientes mencionados en la etiqueta. Al ir de compras  Evite comprar alimentos procesados, enlatados o precocinados. Estos alimentos tienden a International aid/development worker mayor cantidad de [place], sodio y azcar agregada.  Compre en la zona exterior de la tienda de comestibles. Esta zona incluye  frutas y verduras frescas, granos a granel, carnes frescas y productos lcteos frescos. Al cocinar  Utilice mtodos de coccin a baja temperatura, como hornear, en lugar de mtodos de coccin a alta temperatura, como frer en abundante aceite.  Cocine con aceites saludables, como el aceite de [place], canola o [place].  Evite cocinar con manteca, crema o carnes con alto contenido de grasa. Planificacin de las comidas  Coma las comidas y los refrigerios regularmente, preferentemente a la misma hora todos [place]. Evite pasar largos perodos de tiempo sin comer.  Consuma alimentos ricos en fibra, como frutas frescas, verduras, frijoles y cereales integrales. Consulte a su nutricionista sobre cuntas porciones de carbohidratos puede consumir en cada comida.  Consuma entre 4 y 6 onzas (oz) de protenas magras por da, como carnes [place], pollo, pescado, huevos o tofu. Una onza de protena magra equivale a: ? 1 onza de carne, pollo o pescado. ? 1huevo. ?  taza de tofu.  Coma algunos alimentos por da que contengan grasas saludables, como aguacates, frutos secos, semillas y pescado. Estilo  de vida  Controle su nivel de glucemia con regularidad.  Haga actividad fsica habitualmente como se lo haya indicado el mdico. Esto puede incluir lo siguiente: ? semanales de ejercicio de intensidad moderada o alta. Esto podra incluir caminatas dinmicas, ciclismo o gimnasia acutica. ? Realizar ejercicios de elongacin y de fortalecimiento, como yoga o levantamiento de pesas, por lo menos 2veces por semana.  Tome los Monsanto Company se lo haya indicado el mdico.  No consuma ningn producto que contenga nicotina o tabaco, como cigarrillos y Administrator, Civil Service. Si necesita ayuda para dejar de fumar, consulte al CIGNA con un asesor o instructor en diabetes para identificar estrategias para controlar el estrs y cualquier desafo emocional y social. Preguntas para hacerle al mdico  Es necesario que consulte a IT trainer en el cuidado de la diabetes?  Es necesario que me rena con un nutricionista?  A qu nmero puedo llamar si tengo preguntas?  Cules son los mejores momentos para controlar la glucemia? Dnde encontrar ms informacin:  Asociacin Estadounidense de la Diabetes (American Diabetes Association): diabetes.org  Academia de Nutricin y Pension scheme manager (Academy of Nutrition and Dietetics): www.eatright.org  The Kroger de la Diabetes y las Enfermedades Digestivas y [place] [facility] of Diabetes and Digestive and Kidney Diseases, NIH): CarFlippers.tn Resumen  Un plan de alimentacin saludable lo ayudar a Scientist, physiological glucemia y Pharmacologist un estilo de vida saludable.  Trabajar con un especialista en dietas y nutricin (nutricionista) puede ayudarlo a Designer, television/film set de alimentacin para usted.  Tenga en cuenta que los carbohidratos (hidratos de carbono) y el alcohol tienen efectos inmediatos en sus niveles de glucemia. Es importante contar los carbohidratos que ingiere y consumir alcohol con prudencia. Esta  informacin no tiene Theme park manager el consejo del mdico. Asegrese de hacerle al mdico cualquier pregunta que tenga. Document Released: 09/28/2007 Document Revised: 03/01/2017 Document Reviewed: 10/11/2016 Elsevier Patient Education  2020 [employer].

## 2019-05-21 NOTE — Assessment & Plan Note (Signed)
Significant periodontal disease therefore have given the patient a list of dental resources for her to access for oral hygiene

## 2019-05-22 LAB — LIPID PANEL
Chol/HDL Ratio: 3.9 ratio (ref 0.0–4.4)
Cholesterol, Total: 159 mg/dL (ref 100–199)
HDL: 41 mg/dL (ref >39)
LDL Chol Calc (NIH): 74 mg/dL (ref 0–99)
Triglycerides: 270 mg/dL — ABNORMAL HIGH (ref 0–149)
VLDL Cholesterol Cal: 44 mg/dL — ABNORMAL HIGH (ref 5–40)

## 2019-05-22 LAB — COMPREHENSIVE METABOLIC PANEL
ALT: 88 IU/L — ABNORMAL HIGH (ref 0–32)
AST: 66 IU/L — ABNORMAL HIGH (ref 0–40)
Albumin/Globulin Ratio: 1.5 (ref 1.2–2.2)
Albumin: 4.7 g/dL (ref 3.8–4.8)
Alkaline Phosphatase: 81 IU/L (ref 39–117)
BUN/Creatinine Ratio: 15 (ref 9–23)
BUN: 10 mg/dL (ref 6–20)
Bilirubin Total: 0.9 mg/dL (ref 0.0–1.2)
CO2: 21 mmol/L (ref 20–29)
Calcium: 9.7 mg/dL (ref 8.7–10.2)
Chloride: 100 mmol/L (ref 96–106)
Creatinine, Ser: 0.67 mg/dL (ref 0.57–1.00)
GFR calc Af Amer: 129 mL/min/{1.73_m2} (ref >59)
GFR calc non Af Amer: 112 mL/min/{1.73_m2} (ref >59)
Globulin, Total: 3.1 g/dL (ref 1.5–4.5)
Glucose: 140 mg/dL — ABNORMAL HIGH (ref 65–99)
Potassium: 4.4 mmol/L (ref 3.5–5.2)
Sodium: 140 mmol/L (ref 134–144)
Total Protein: 7.8 g/dL (ref 6.0–8.5)

## 2019-05-22 LAB — CBC WITH DIFFERENTIAL/PLATELET
Basophils Absolute: 0.1 10*3/uL (ref 0.0–0.2)
Basos: 1 %
EOS (ABSOLUTE): 0.1 10*3/uL (ref 0.0–0.4)
Eos: 1 %
Hematocrit: 40.5 % (ref 34.0–46.6)
Hemoglobin: 13.9 g/dL (ref 11.1–15.9)
Immature Grans (Abs): 0.1 10*3/uL (ref 0.0–0.1)
Immature Granulocytes: 1 %
Lymphocytes Absolute: 5.3 10*3/uL — ABNORMAL HIGH (ref 0.7–3.1)
Lymphs: 46 %
MCH: 28.5 pg (ref 26.6–33.0)
MCHC: 34.3 g/dL (ref 31.5–35.7)
MCV: 83 fL (ref 79–97)
Monocytes Absolute: 0.6 10*3/uL (ref 0.1–0.9)
Monocytes: 5 %
Neutrophils Absolute: 5.2 10*3/uL (ref 1.4–7.0)
Neutrophils: 46 %
Platelets: 332 10*3/uL (ref 150–450)
RBC: 4.87 x10E6/uL (ref 3.77–5.28)
RDW: 12.9 % (ref 11.7–15.4)
WBC: 11.4 10*3/uL — ABNORMAL HIGH (ref 3.4–10.8)

## 2019-05-28 ENCOUNTER — Telehealth: Payer: Self-pay | Admitting: *Deleted

## 2019-05-28 ENCOUNTER — Ambulatory Visit: Payer: Self-pay | Attending: Family Medicine

## 2019-05-28 ENCOUNTER — Other Ambulatory Visit: Payer: Self-pay

## 2019-05-28 NOTE — Telephone Encounter (Signed)
Medical Assistant used [employer] to contact patient.  Interpreter Name: Interpreter #: [phone] Wallace states to give a call back to [place] with [facility] at [phone]. Medical Assistant left message on patient's home and cell voicemail. !!!Labs are all normal, continue current medications!!!

## 2019-05-28 NOTE — Telephone Encounter (Signed)
-----   Message from Elsie Stain, MD sent at 05/22/2019  5:47 AM EST ----- pls let ms dominguez know her CBC is normal,  He cholesterol is satisfactory, no medication needed,  Her metabolic panel is normal: normal kidneys, electrolytes, liver function.   No additional medication changes

## 2019-07-24 ENCOUNTER — Encounter: Payer: Self-pay | Admitting: Critical Care Medicine

## 2019-07-24 ENCOUNTER — Other Ambulatory Visit: Payer: Self-pay

## 2019-07-24 ENCOUNTER — Ambulatory Visit: Payer: Self-pay | Attending: Critical Care Medicine | Admitting: Critical Care Medicine

## 2019-07-24 DIAGNOSIS — R05 Cough: Secondary | ICD-10-CM

## 2019-07-24 DIAGNOSIS — Z124 Encounter for screening for malignant neoplasm of cervix: Secondary | ICD-10-CM

## 2019-07-24 DIAGNOSIS — E119 Type 2 diabetes mellitus without complications: Secondary | ICD-10-CM

## 2019-07-24 DIAGNOSIS — J209 Acute bronchitis, unspecified: Secondary | ICD-10-CM

## 2019-07-24 MED ORDER — AZITHROMYCIN 250 MG PO TABS: 250.0000 mg | ORAL_TABLET | Freq: Every day | ORAL | 0 refills | Status: DC

## 2019-07-24 MED ORDER — DM-GUAIFENESIN ER 30-600 MG PO TB12: 1.0000 | ORAL_TABLET | Freq: Two times a day (BID) | ORAL | 0 refills | Status: DC

## 2019-07-24 MED FILL — AZITHROMYCIN 250 MG TABLET: 250 | 5 days supply | Qty: 6 | Fill #0

## 2019-07-24 NOTE — Progress Notes (Signed)
Subjective:    Patient ID: Betty Thomas, female    DOB: July 09, 1980, 39 y.o.   MRN: [id number]  Note this visit was conducted with a Spanish interpreter throughout the entire visit whose name was Minette Brine via the [employer] interpretation online service  This is a very pleasant 39 year old Latina female who immigrated from [place] 15 years ago but has not had regular primary care follow-up.  The patient has had 5 children all of them are healthy.  She had gestational diabetes with her last pregnancy in 2016.  The patient was seen in our [place] screening clinic on referral from [employer] [facility] and found to have an elevated blood glucose of 380.  We started her on oral metformin 5 mg twice daily she comes in today for follow-up.  Note her CBG is elevated at this visit and her hemoglobin A1c is 9.7 at this visit  The patient does not yet have a meter to follow her blood sugar.  She does states she has some difficulty with soreness in her gums.  She denies chest pain shortness of breath cough urgency frequency flank pain abdominal pain nausea vomiting diarrhea or other specific problems.  See review of system below.  There is no prior history of hypertension.  She is not had labs checked recently.  She did have anemia with her last pregnancy and no follow-up hemoglobin is yet to be obtained.   07/24/2019 the visit was accomplished with [place] from [facility] interpreters for [place] This is a telephone follow-up visit with the last office visit being in November.  The patient previously had uncontrolled type 2 diabetes with hemoglobin A1c of 9.7 at that last visit.  She also had periodontal disease as well.  Since that time she has been on Metformin at 1000 mg twice daily and tolerating this well.  Her most recent blood glucose readings have been anywhere from 98-1 04 with a low of 86.  She is eating a healthier diet.  A secondary issue has been having increased cough with increased  mucus production for the past 2weeks she denies fever, muscle aches, ear pain, or chills.  She has not lost taste or smell.  She has a slight headache in the frontal area.  There are no other complaints  Cough This is a new problem. The current episode started 1 to 4 weeks ago. The problem has been gradually worsening. The cough is productive of purulent sputum and productive of sputum. Associated symptoms include nasal congestion, postnasal drip and rhinorrhea. Pertinent negatives include no chest pain, chills, ear congestion, ear pain, fever, headaches, heartburn, hemoptysis, myalgias, rash, shortness of breath or wheezing. Associated symptoms comments: Chest tightness . The symptoms are aggravated by lying down. There is no history of asthma.   Past Medical History:  Diagnosis Date  . Gestational diabetes    G6, diet controlled  . Postpartum hemorrhage 2011   G5     History reviewed. No pertinent family history.   Social History   Socioeconomic History  . Marital status: Married    Spouse name: Not on file  . Number of children: Not on file  . Years of education: Not on file  . Highest education level: Not on file  Occupational History  . Not on file  Tobacco Use  . Smoking status: Never Smoker  . Smokeless tobacco: Never Used  Substance and Sexual Activity  . Alcohol use: No  . Drug use: No  . Sexual activity: Yes  Other  Topics Concern  . Not on file  Social History Narrative  . Not on file   Social Determinants of Health   Financial Resource Strain:   . Difficulty of Paying Living Expenses: Not on file  Food Insecurity:   . Worried About Charity fundraiser in the Last Year: Not on file  . Ran Out of Food in the Last Year: Not on file  Transportation Needs:   . Lack of Transportation (Medical): Not on file  . Lack of Transportation (Non-Medical): Not on file  Physical Activity:   . Days of Exercise per Week: Not on file  . Minutes of Exercise per Session: Not  on file  Stress:   . Feeling of Stress : Not on file  Social Connections:   . Frequency of Communication with Friends and Family: Not on file  . Frequency of Social Gatherings with Friends and Family: Not on file  . Attends Religious Services: Not on file  . Active Member of Clubs or Organizations: Not on file  . Attends Archivist Meetings: Not on file  . Marital Status: Not on file  Intimate Partner Violence:   . Fear of Current or Ex-Partner: Not on file  . Emotionally Abused: Not on file  . Physically Abused: Not on file  . Sexually Abused: Not on file     No Known Allergies   Outpatient Medications Prior to Visit  Medication Sig Dispense Refill  . Blood Glucose Monitoring Suppl (TRUE METRIX METER) w/Device KIT Use to measure blood sugar twice a day 1 kit 0  . glucose blood (TRUE METRIX BLOOD GLUCOSE TEST) test strip Use as instructed 100 each 12  . ibuprofen (ADVIL,MOTRIN) 200 MG tablet Take 200 mg by mouth every 6 (six) hours as needed for pain.    . metFORMIN (GLUCOPHAGE) 1000 MG tablet Take 1 tablet (1,000 mg total) by mouth 2 (two) times daily with a meal. 60 tablet 3  . Prenatal Vit-Fe Fumarate-FA (PRENATAL MULTIVITAMIN) TABS tablet Take 1 tablet by mouth daily at 12 noon.    . TRUEplus Lancets 28G MISC Use to measure blood sugar twice a day 100 each 1   No facility-administered medications prior to visit.    Review of Systems  Constitutional: Negative for chills and fever.  HENT: Positive for postnasal drip and rhinorrhea. Negative for ear pain.   Respiratory: Positive for cough. Negative for hemoptysis, shortness of breath and wheezing.   Cardiovascular: Negative for chest pain.  Gastrointestinal: Negative for heartburn.  Musculoskeletal: Negative for myalgias.  Skin: Negative for rash.  Neurological: Negative for headaches.     Objective:   Physical Exam There were no vitals filed for this visit. No exam this is a phone visit    Assessment & Plan:    I personally reviewed all images and lab data in the [facility] system as well as any outside material available during this office visit and agree with the  radiology impressions.   Acute bronchitis Acute tracheobronchitis based on patient's symptom complex  Plan will be for the patient receive azithromycin for 5 days and Mucinex DM and we will follow her up with an office exam in February  Controlled type 2 diabetes mellitus ([place]) Type 2 diabetes with excellent control based on her reports of her current blood sugars  Plan will be for the patient come in for an office exam and further lab data in February  I reminded her she needs to get an eye exam as  well this year   Emiley was seen today for diabetes.  Diagnoses and all orders for this visit:  Controlled type 2 diabetes mellitus without complication, without long-term current use of insulin (HCC)  Acute bronchitis, unspecified organism  Other orders -     azithromycin (ZITHROMAX) 250 MG tablet; Take 1 tablet (250 mg total) by mouth daily. Take two once then one daily until gone -     dextromethorphan-guaiFENesin (MUCINEX DM) 30-600 MG 12hr tablet; Take 1 tablet by mouth 2 (two) times daily.   Pt will also receive a referral to gyN for pap smear

## 2019-07-25 MED FILL — metFORMIN HCL 1000 MG TABS: 1000 | 30 days supply | Qty: 60 | Fill #1

## 2019-07-25 NOTE — Assessment & Plan Note (Signed)
Type 2 diabetes with excellent control based on her reports of her current blood sugars  Plan will be for the patient come in for an office exam and further lab data in February  I reminded her she needs to get an eye exam as well this year

## 2019-07-25 NOTE — Assessment & Plan Note (Signed)
Acute tracheobronchitis based on patient's symptom complex  Plan will be for the patient receive azithromycin for 5 days and Mucinex DM and we will follow her up with an office exam in February

## 2019-08-29 ENCOUNTER — Ambulatory Visit: Payer: Self-pay | Admitting: Critical Care Medicine

## 2019-09-17 NOTE — Progress Notes (Signed)
Subjective:    Patient ID: Betty Thomas, female    DOB: 02-Nov-1980, 39 y.o.   MRN: [id number]  Note this visit was conducted with a Spanish interpreter throughout the entire visit whose name was Minette Brine via the [employer] interpretation online service  This is a very pleasant 39 year old Latina female who immigrated from [place] 15 years ago but has not had regular primary care follow-up.  The patient has had 5 children all of them are healthy.  She had gestational diabetes with her last pregnancy in 2016.  The patient was seen in our [place] screening clinic on referral from [place] and found to have an elevated blood glucose of 380.  We started her on oral metformin 5 mg twice daily she comes in today for follow-up.  Note her CBG is elevated at this visit and her hemoglobin A1c is 9.7 at this visit  The patient does not yet have a meter to follow her blood sugar.  She does states she has some difficulty with soreness in her gums.  She denies chest pain shortness of breath cough urgency frequency flank pain abdominal pain nausea vomiting diarrhea or other specific problems.  See review of system below.  There is no prior history of hypertension.  She is not had labs checked recently.  She did have anemia with her last pregnancy and no follow-up hemoglobin is yet to be obtained.   07/24/2019 the visit was accomplished with [place] from [facility] interpreters for [place] This is a telephone follow-up visit with the last office visit being in November.  The patient previously had uncontrolled type 2 diabetes with hemoglobin A1c of 9.7 at that last visit.  She also had periodontal disease as well.  Since that time she has been on Metformin at 1000 mg twice daily and tolerating this well.  Her most recent blood glucose readings have been anywhere from 98-1 04 with a low of 86.  She is eating a healthier diet.  A secondary issue has been having increased cough with increased  mucus production for the past 2weeks she denies fever, muscle aches, ear pain, or chills.  She has not lost taste or smell.  She has a slight headache in the frontal area.  There are no other complaints  3/16: This is a 39 year old Latina female history of type 2 diabetes with improving control and obesity.  Since the last visit her blood glucoses have continued to improve.  She is now seeing glucose ranges in the 90-100 range on her current profile of Metformin.  She has been more adherent to her diet is actually lost additional weight since the last visit.  She also had tracheobronchitis when seen previously this is now completely resolved.  Today she does complain of some episodic low back pain.  She has a pending appointment with dentist for her periodontal disease and she was given today resources for an eye exam that she needs    Past Medical History:  Diagnosis Date  . Gestational diabetes    G6, diet controlled  . Postpartum hemorrhage 2011   G5     History reviewed. No pertinent family history.   Social History   Socioeconomic History  . Marital status: Married    Spouse name: Not on file  . Number of children: Not on file  . Years of education: Not on file  . Highest education level: Not on file  Occupational History  . Not on file  Tobacco Use  .  Smoking status: Never Smoker  . Smokeless tobacco: Never Used  Substance and Sexual Activity  . Alcohol use: No  . Drug use: No  . Sexual activity: Yes  Other Topics Concern  . Not on file  Social History Narrative  . Not on file   Social Determinants of Health   Financial Resource Strain:   . Difficulty of Paying Living Expenses:   Food Insecurity:   . Worried About Charity fundraiser in the Last Year:   . Arboriculturist in the Last Year:   Transportation Needs:   . Film/video editor (Medical):   Marland Kitchen Lack of Transportation (Non-Medical):   Physical Activity:   . Days of Exercise per Week:   . Minutes of  Exercise per Session:   Stress:   . Feeling of Stress :   Social Connections:   . Frequency of Communication with Friends and Family:   . Frequency of Social Gatherings with Friends and Family:   . Attends Religious Services:   . Active Member of Clubs or Organizations:   . Attends Archivist Meetings:   Marland Kitchen Marital Status:   Intimate Partner Violence:   . Fear of Current or Ex-Partner:   . Emotionally Abused:   Marland Kitchen Physically Abused:   . Sexually Abused:      No Known Allergies   Outpatient Medications Prior to Visit  Medication Sig Dispense Refill  . Blood Glucose Monitoring Suppl (TRUE METRIX METER) w/Device KIT Use to measure blood sugar twice a day 1 kit 0  . glucose blood (TRUE METRIX BLOOD GLUCOSE TEST) test strip Use as instructed 100 each 12  . ibuprofen (ADVIL,MOTRIN) 200 MG tablet Take 200 mg by mouth every 6 (six) hours as needed for pain.    . Prenatal Vit-Fe Fumarate-FA (PRENATAL MULTIVITAMIN) TABS tablet Take 1 tablet by mouth daily at 12 noon.    . TRUEplus Lancets 28G MISC Use to measure blood sugar twice a day 100 each 1  . dextromethorphan-guaiFENesin (MUCINEX DM) 30-600 MG 12hr tablet Take 1 tablet by mouth 2 (two) times daily. 30 tablet 0  . metFORMIN (GLUCOPHAGE) 1000 MG tablet Take 1 tablet (1,000 mg total) by mouth 2 (two) times daily with a meal. 60 tablet 3  . azithromycin (ZITHROMAX) 250 MG tablet Take 1 tablet (250 mg total) by mouth daily. Take two once then one daily until gone (Patient not taking: Reported on 09/18/2019) 6 tablet 0   No facility-administered medications prior to visit.    Review of Systems     Objective:   Physical Exam  Vitals:   09/18/19 1049  BP: 125/85  Pulse: 75  Temp: 98.1 F (36.7 C)  TempSrc: Oral  SpO2: 94%  Weight: 169 lb 9.6 oz (76.9 kg)  Height: _0  (1.549 m)    Gen: Pleasant, well-nourished, in no distress,  normal affect  ENT: No lesions,  mouth clear,  oropharynx clear, no postnasal drip  Neck:  No JVD, no TMG, no carotid bruits  Lungs: No use of accessory muscles, no dullness to percussion, clear without rales or rhonchi  Cardiovascular: RRR, heart sounds normal, no murmur or gallops, no peripheral edema  Abdomen: soft and NT, no HSM,  BS normal  Musculoskeletal: No deformities, no cyanosis or clubbing  Neuro: alert, non focal  Skin: Warm, no lesions or rashes    Assessment & Plan:  I personally reviewed all images and lab data in the [facility] system as well as any  outside material available during this office visit and agree with the  radiology impressions.   Controlled type 2 diabetes mellitus ([place]) Controlled type 2 diabetes with improved control at this time we will continue Metformin 500 mg twice daily  We will obtain a urine microalbumin  We gave resources to the patient obtain an eye exam in this calendar year  Lumbar back pain Lumbar back strain with muscular spasm noted on exam  Do not believe imaging is necessary  Patient to use ibuprofen as needed and will give cyclobenzaprine 5 mg 3 times daily as needed for muscle spasm I also gave the patient backs exercises to perform   Diagnoses and all orders for this visit:  Controlled type 2 diabetes mellitus without complication, without long-term current use of insulin ([place]) -     Microalbumin, urine  Cervical cancer screening -     Ambulatory referral to Obstetrics / Gynecology  Lumbar back pain  Other orders -     cyclobenzaprine (FLEXERIL) 5 MG tablet; Take 1 tablet (5 mg total) by mouth 3 (three) times daily as needed for muscle spasms. -     metFORMIN (GLUCOPHAGE) 1000 MG tablet; Take 1 tablet (1,000 mg total) by mouth 2 (two) times daily with a meal.   Pt will also receive a referral to gyN for pap smear

## 2019-09-18 ENCOUNTER — Ambulatory Visit: Payer: Self-pay | Attending: Critical Care Medicine | Admitting: Critical Care Medicine

## 2019-09-18 ENCOUNTER — Encounter: Payer: Self-pay | Admitting: Critical Care Medicine

## 2019-09-18 ENCOUNTER — Other Ambulatory Visit: Payer: Self-pay

## 2019-09-18 VITALS — BP 125/85 | HR 75 | Temp 98.1°F | Ht 61.0 in | Wt 169.6 lb

## 2019-09-18 DIAGNOSIS — M545 Low back pain, unspecified: Secondary | ICD-10-CM

## 2019-09-18 DIAGNOSIS — Z124 Encounter for screening for malignant neoplasm of cervix: Secondary | ICD-10-CM

## 2019-09-18 DIAGNOSIS — E119 Type 2 diabetes mellitus without complications: Secondary | ICD-10-CM

## 2019-09-18 DIAGNOSIS — E1165 Type 2 diabetes mellitus with hyperglycemia: Secondary | ICD-10-CM

## 2019-09-18 MED ORDER — METFORMIN HCL 1000 MG PO TABS
1000.0000 mg | ORAL_TABLET | Freq: Two times a day (BID) | ORAL | 3 refills | Status: DC
Start: 2019-09-18 — End: 2020-02-21

## 2019-09-18 MED ORDER — CYCLOBENZAPRINE HCL 5 MG PO TABS: 5.0000 mg | ORAL_TABLET | Freq: Three times a day (TID) | ORAL | 0 refills | Status: DC | PRN

## 2019-09-18 MED FILL — metFORMIN HCL 1000 MG TABS: 1000 | 30 days supply | Qty: 60 | Fill #0

## 2019-09-18 MED FILL — CYCLOBENZAPRINE 5 MG TABLET: 5 | 20 days supply | Qty: 60 | Fill #0

## 2019-09-18 NOTE — Assessment & Plan Note (Addendum)
Controlled type 2 diabetes with improved control at this time we will continue Metformin 500 mg twice daily  We will obtain a urine microalbumin  We gave resources to the patient obtain an eye exam in this calendar year

## 2019-09-18 NOTE — Assessment & Plan Note (Signed)
Lumbar back strain with muscular spasm noted on exam  Do not believe imaging is necessary  Patient to use ibuprofen as needed and will give cyclobenzaprine 5 mg 3 times daily as needed for muscle spasm I also gave the patient backs exercises to perform

## 2019-09-18 NOTE — Patient Instructions (Addendum)
No change in medications Trial of cyclobenzaprine three times daily as needed for back muscle spasm  Use exercises below for your back pain  Please see an eye doctor for a retinal exam   Another referral to gynecology was made for a pap smear  A urine for microalbumin was obtained  Return to see Dr Delford Field in 4 months   Ejercicios para la espalda Back Exercises Estos ejercicios ayudan a fortalecer el tronco y la espalda. adems, ayudan a mantener la flexibilidad de la zona lumbar. Hacer estos ejercicios puede ser de ayuda para evitar o Engineer, materials de espalda.  Si tiene dolor de espalda, trate de Museum/gallery exhibitions officer ejercicios 2 o 3veces por da, o como se lo haya indicado el mdico.  A medida que [place], haga los ejercicios una vez por da. Repita los ejercicios con ms frecuencia como se lo haya indicado el mdico.  Para evitar que el dolor regrese, haga los ejercicios una vez por da o como se lo haya indicado el mdico. Ejercicios Rodilla al pecho Repita estos pasos 3 o 5veces seguidas con cada pierna: 1. Acustese boca arriba sobre una cama dura o sobre el suelo con las piernas extendidas. 2. Lleve una rodilla al pecho. 3. Agarre la rodilla o el muslo con ambas manos y sostngalo en su lugar. 4. Tire de la rodilla hasta sentir una elongacin suave en la parte baja de la espalda o las nalgas. 5. Mantenga la elongacin durante 10 a 30segundos. 6. Suelte y extienda la pierna lentamente. Inclinacin de la pelvis Repita estos pasos 5 o 10veces seguidas: 1. Acustese boca arriba sobre una cama dura o sobre el suelo con las piernas extendidas. 2. Flexione las rodillas de manera que apunten al techo. Los pies deben estar apoyados en el suelo. 3. Contraiga los msculos de la parte baja del vientre (abdomen) para empujar la zona lumbar contra el suelo. Este movimiento har que el coxis apunte hacia el techo, en lugar de apuntar hacia abajo en direccin a los pies o al  suelo. 4. Mantenga esta posicin durante 5 a 10segundos mientras contrae suavemente los msculos y respira con normalidad. El perro y el gato Repita estos pasos hasta que la zona lumbar se curve con ms facilidad: 1. Apoye las palmas de las manos y las rodillas sobre una superficie firme. Las manos deben estar alineadas con los hombros y las rodillas con las caderas. Puede colocarse almohadillas debajo de las rodillas. 2. Deje que la cabeza cuelgue hacia el pecho. Tense (contraiga) los msculos del vientre. Baje el coxis en direccin al suelo de modo que la zona lumbar se arquee como el lomo de un [place]. 3. Mantenga esta posicin durante 5segundos. 4. [place] lentamente la cabeza. Relaje los msculos del vientre. Eleve el coxis de modo que apunte en direccin al techo para que la espalda forme un arco hundido como el lomo de un perro contento. 5. Mantenga esta posicin durante 5segundos.  Flexiones de brazos Repita estos pasos 5 o 10veces seguidas: 1. Acustese boca abajo en el suelo. 2. Ponga las manos cerca de la cabeza, separadas aproximadamente al ancho de los hombros. 3. Con la espalda relajada y las caderas apoyadas en el suelo, extienda lentamente los brazos para levantar la mitad superior del cuerpo y Optometrist los hombros. No use los msculos de la espalda. Puede cambiar la ubicacin de las manos para estar ms cmodo. 4. Mantenga esta posicin durante 5segundos. 5. Lentamente vuelva a la posicin horizontal.  Puentes Repita  estos pasos 10veces seguidas: 1. Acustese boca arriba sobre una superficie firme. 2. Flexione las rodillas de manera que apunten al techo. Los pies deben estar apoyados en el suelo. Los brazos deben estar paralelos a los costados del cuerpo, cerca del cuerpo. 3. Contraiga los glteos y despegue las nalgas del suelo hasta que la cintura est casi a la altura de las rodillas. Si no siente el trabajo muscular en las nalgas y la parte posterior de los  muslos, aleje los pies 1 o 2pulgadas (2.5 o 5centmetros) de las nalgas. 4. Mantenga esta posicin durante 3 a 5segundos. 5. Lentamente, vuelva a apoyar las nalgas en el suelo y relaje los glteos. Si este ejercicio le resulta muy fcil, intente realizarlo con los brazos cruzados [place]. Abdominales Repita estos pasos 5 o 10veces seguidas: 1. Acustese boca arriba sobre una cama dura o sobre el suelo con las piernas extendidas. 2. Flexione las rodillas de manera que apunten al techo. Los pies deben estar apoyados en el suelo. 3. Cruce los [employer]. 4. Baje levemente el mentn en direccin al pecho, pero no doble el cuello. 5. Contraiga los msculos del abdomen y con lentitud eleve el pecho lo suficiente como para despegar levemente los omplatos del suelo. Evite levantar el cuerpo ms alto que eso, porque puede sobreexigir la zona lumbar. 6. Lentamente baje el pecho y la cabeza hasta el suelo. Elevaciones de espalda Repita estos pasos 5 o 10veces seguidas: 1. Acustese boca abajo con los brazos a los costados y apoye la frente en el suelo. 2. Contraiga los msculos de las piernas y los glteos. 3. Lentamente despegue el pecho del suelo mientras mantiene las caderas apoyadas en el suelo. Mantenga la nuca alineada con la curvatura de la espalda. Mire hacia el suelo mientras hace este ejercicio. 4. Mantenga esta posicin durante 3 a 5segundos. 5. Lentamente baje el pecho y el rostro hasta el suelo. Comunquese con un mdico si:  El dolor de espalda se vuelve mucho ms intenso cuando hace un ejercicio.  El dolor de espalda no mejora 2horas despus de [employer] ejercicios. Si tiene alguno de [employer], deje de [employer] ejercicios. No vuelva a hacer los ejercicios a menos que el mdico lo autorice. Solicite ayuda inmediatamente si:  Siente un dolor sbito y muy intenso en la espalda. Si esto ocurre, deje de [employer]. No vuelva a hacer los ejercicios  a menos que el mdico lo autorice. Esta informacin no tiene Marine scientist el consejo del mdico. Asegrese de hacerle al mdico cualquier pregunta que tenga. Document Revised: 04/20/2018 Document Reviewed: 04/20/2018 Elsevier Patient Education  [place].

## 2019-09-18 NOTE — Progress Notes (Signed)
Entire lower back pain since Dec 2020  Interpreter Jesus [id number]

## 2019-09-19 LAB — MICROALBUMIN, URINE: Microalbumin, Urine: 18 ug/mL

## 2019-11-07 MED FILL — metFORMIN HCL 1000 MG TABS: 1000 | 30 days supply | Qty: 60 | Fill #1

## 2020-01-01 MED FILL — metFORMIN HCL 1000 MG TABS: 1000 | 30 days supply | Qty: 60 | Fill #2

## 2020-02-20 MED FILL — metFORMIN HCL 1000 MG TABS: 1000 | 30 days supply | Qty: 60 | Fill #3

## 2020-02-21 ENCOUNTER — Telehealth: Payer: Self-pay | Admitting: *Deleted

## 2020-02-21 MED ORDER — METFORMIN HCL 1000 MG PO TABS: 1000.0000 mg | ORAL_TABLET | Freq: Two times a day (BID) | ORAL | 3 refills | Status: DC

## 2020-02-21 NOTE — Telephone Encounter (Signed)
Patient requested a refill on metformin. Please send to [facility] pharmacy

## 2020-02-21 NOTE — Addendum Note (Signed)
Addended by: Lois Huxley, Jeannett Senior L on: 02/21/2020 04:43 PM   Modules accepted: Orders

## 2020-02-21 NOTE — Telephone Encounter (Signed)
Rx sent 

## 2020-02-26 MED FILL — metFORMIN HCL 1000 MG TABS: 1000 | 30 days supply | Qty: 60 | Fill #0

## 2020-03-06 ENCOUNTER — Ambulatory Visit: Payer: Self-pay | Attending: Critical Care Medicine

## 2020-03-06 ENCOUNTER — Other Ambulatory Visit: Payer: Self-pay

## 2020-03-12 ENCOUNTER — Ambulatory Visit: Payer: Self-pay

## 2020-03-12 ENCOUNTER — Other Ambulatory Visit: Payer: Self-pay

## 2020-04-09 ENCOUNTER — Ambulatory Visit: Payer: Self-pay | Admitting: Critical Care Medicine

## 2020-04-17 ENCOUNTER — Other Ambulatory Visit: Payer: Self-pay

## 2020-04-17 ENCOUNTER — Ambulatory Visit: Payer: Self-pay | Attending: Critical Care Medicine | Admitting: Physician Assistant

## 2020-04-17 ENCOUNTER — Encounter: Payer: Self-pay | Admitting: Physician Assistant

## 2020-04-17 ENCOUNTER — Other Ambulatory Visit: Payer: Self-pay | Admitting: Physician Assistant

## 2020-04-17 VITALS — BP 121/81 | HR 66 | Temp 97.7°F | Ht 61.0 in | Wt 164.6 lb

## 2020-04-17 DIAGNOSIS — M545 Low back pain, unspecified: Secondary | ICD-10-CM

## 2020-04-17 DIAGNOSIS — E1165 Type 2 diabetes mellitus with hyperglycemia: Secondary | ICD-10-CM

## 2020-04-17 DIAGNOSIS — Z789 Other specified health status: Secondary | ICD-10-CM

## 2020-04-17 DIAGNOSIS — Z23 Encounter for immunization: Secondary | ICD-10-CM

## 2020-04-17 LAB — POCT GLYCOSYLATED HEMOGLOBIN (HGB A1C): Hemoglobin A1C: 6.3 % — AB (ref 4.0–5.6)

## 2020-04-17 LAB — GLUCOSE, POCT (MANUAL RESULT ENTRY): POC Glucose: 104 mg/dl — AB (ref 70–99)

## 2020-04-17 MED ORDER — METFORMIN HCL 1000 MG PO TABS: 1000.0000 mg | ORAL_TABLET | Freq: Two times a day (BID) | ORAL | 5 refills | Status: DC

## 2020-04-17 MED ORDER — CYCLOBENZAPRINE HCL 5 MG PO TABS: 5.0000 mg | ORAL_TABLET | Freq: Every day | ORAL | 3 refills | Status: DC

## 2020-04-17 MED FILL — CYCLOBENZAPRINE 5 MG TABLET: 5 | 30 days supply | Qty: 30 | Fill #0

## 2020-04-17 MED FILL — METFORMIN HCL 1000 MG TABS: 1000 | 30 days supply | Qty: 60 | Fill #0

## 2020-04-17 NOTE — Patient Instructions (Signed)
Drink 80-100 ounces water daily  Add exercise into your life daily.  Eliminate sugar and sweets and limit white carbohydrates in your diet

## 2020-04-17 NOTE — Progress Notes (Signed)
Patient ID: Betty Thomas, female   DOB: 1981/06/08, 39 y.o.   MRN: [id number]     Betty Thomas, is a 39 y.o. female  ASN:[id number]  LPF:[id number]  DOB - March 18, 1981  Subjective:  Chief Complaint and HPI: Betty Thomas is a 39 y.o. female here today for med RF and DM check.  [place] interpreting.  Blood sugars at home run 90-130.  Not following diabetic diet.  No hypoglycemia.  Compliant with meds.   Wants flu shot today.  Wants RF on flexeril for occasional back pain and muscle spasms  ROS:   Constitutional:  No f/c, No night sweats, No unexplained weight loss. EENT:  No vision changes, No blurry vision, No hearing changes. No mouth, throat, or ear problems.  Respiratory: No cough, No SOB Cardiac: No CP, no palpitations GI:  No abd pain, No N/V/D. GU: No Urinary s/sx Musculoskeletal: low back pain at times.  No radicular s/sx Neuro: No headache, no dizziness, no motor weakness.  Skin: No rash Endocrine:  No polydipsia. No polyuria.  Psych: Denies SI/HI  No problems updated.  ALLERGIES: No Known Allergies  PAST MEDICAL HISTORY: Past Medical History:  Diagnosis Date  . Gestational diabetes    G6, diet controlled  . Postpartum hemorrhage 2011   G5    MEDICATIONS AT HOME: Prior to Admission medications   Medication Sig Start Date End Date Taking? Authorizing Provider  Blood Glucose Monitoring Suppl (TRUE METRIX METER) w/Device KIT Use to measure blood sugar twice a day 05/21/19  Yes Elsie Stain, MD  cyclobenzaprine (FLEXERIL) 5 MG tablet Take 1 tablet (5 mg total) by mouth at bedtime. 04/17/20  Yes Freeman Caldron M, PA-C  glucose blood (TRUE METRIX BLOOD GLUCOSE TEST) test strip Use as instructed 05/21/19  Yes Elsie Stain, MD  ibuprofen (ADVIL,MOTRIN) 200 MG tablet Take 200 mg by mouth every 6 (six) hours as needed for pain.   Yes [provider]  metFORMIN (GLUCOPHAGE) 1000 MG tablet Take 1 tablet (1,000 mg  total) by mouth 2 (two) times daily with a meal. 04/17/20  Yes Kamyah Wilhelmsen, Dionne Bucy, PA-C  Prenatal Vit-Fe Fumarate-FA (PRENATAL MULTIVITAMIN) TABS tablet Take 1 tablet by mouth daily at 12 noon.   Yes [provider]  TRUEplus Lancets 28G MISC Use to measure blood sugar twice a day 05/21/19  Yes Elsie Stain, MD     Objective:  EXAM:   Vitals:   04/17/20 0936  BP: 121/81  Pulse: 66  Temp: 97.7 F (36.5 C)  TempSrc: Temporal  SpO2: 99%  Weight: 164 lb 9.6 oz (74.7 kg)  Height: 5' 1"  (1.549 m)    General appearance : A&OX3. NAD. Non-toxic-appearing HEENT: Atraumatic and Normocephalic.  PERRLA. EOM intact.  Chest/Lungs:  Breathing-non-labored, Good air entry bilaterally, breath sounds normal without rales, rhonchi, or wheezing  CVS: S1 S2 regular, no murmurs, gallops, rubs  Extremities: Bilateral Lower Ext shows no edema, both legs are warm to touch with = pulse throughout Neurology:  CN II-XII grossly intact, Non focal.   Psych:  TP linear. J/I WNL. Normal speech. Appropriate eye contact and affect.  Skin:  No Rash  Data Review Lab Results  Component Value Date   HGBA1C 6.3 (A) 04/17/2020   HGBA1C 9.7 (A) 05/21/2019     Assessment & Plan   1. Uncontrolled type 2 diabetes mellitus with hyperglycemia (HCC) Much improved!!!  Continue current regimen and eliminate sugar from diet.  Drink 80-100 ounces water daily - Glucose (CBG) - HgB  A1c - Comprehensive metabolic panel - Lipid panel - CBC with Differential/Platelet - metFORMIN (GLUCOPHAGE) 1000 MG tablet; Take 1 tablet (1,000 mg total) by mouth 2 (two) times daily with a meal.  Dispense: 60 tablet; Refill: 5  2. Lumbar back pain Heat, stretching, ibuprofen and prn use of flexeril - cyclobenzaprine (FLEXERIL) 5 MG tablet; Take 1 tablet (5 mg total) by mouth at bedtime.  Dispense: 30 tablet; Refill: 3  3. Language barrier pacific interpreters used and additional time performing visit was  required.   Patient have been counseled extensively about nutrition and exercise  Return in about 6 months (around 10/16/2020) for PCP.  The patient was given clear instructions to go to ER or return to medical center if symptoms don't improve, worsen or new problems develop. The patient verbalized understanding. The patient was told to call to get lab results if they haven't heard anything in the next week.     Freeman Caldron, PA-C [facility] [place], [place]   04/17/2020, 9:53 AM

## 2020-04-18 LAB — CBC WITH DIFFERENTIAL/PLATELET
Basophils Absolute: 0.1 10*3/uL (ref 0.0–0.2)
Basos: 1 %
EOS (ABSOLUTE): 0.1 10*3/uL (ref 0.0–0.4)
Eos: 1 %
Hematocrit: 37.2 % (ref 34.0–46.6)
Hemoglobin: 12.8 g/dL (ref 11.1–15.9)
Immature Grans (Abs): 0 10*3/uL (ref 0.0–0.1)
Immature Granulocytes: 0 %
Lymphocytes Absolute: 5.3 10*3/uL — ABNORMAL HIGH (ref 0.7–3.1)
Lymphs: 43 %
MCH: 28.6 pg (ref 26.6–33.0)
MCHC: 34.4 g/dL (ref 31.5–35.7)
MCV: 83 fL (ref 79–97)
Monocytes Absolute: 0.6 10*3/uL (ref 0.1–0.9)
Monocytes: 5 %
Neutrophils Absolute: 6.1 10*3/uL (ref 1.4–7.0)
Neutrophils: 50 %
Platelets: 338 10*3/uL (ref 150–450)
RBC: 4.47 x10E6/uL (ref 3.77–5.28)
RDW: 13.2 % (ref 11.7–15.4)
WBC: 12.2 10*3/uL — ABNORMAL HIGH (ref 3.4–10.8)

## 2020-04-18 LAB — LIPID PANEL
Chol/HDL Ratio: 3.9 ratio (ref 0.0–4.4)
Cholesterol, Total: 139 mg/dL (ref 100–199)
HDL: 36 mg/dL — ABNORMAL LOW (ref >39)
LDL Chol Calc (NIH): 65 mg/dL (ref 0–99)
Triglycerides: 235 mg/dL — ABNORMAL HIGH (ref 0–149)
VLDL Cholesterol Cal: 38 mg/dL (ref 5–40)

## 2020-04-18 LAB — COMPREHENSIVE METABOLIC PANEL
ALT: 25 IU/L (ref 0–32)
AST: 19 IU/L (ref 0–40)
Albumin/Globulin Ratio: 1.4 (ref 1.2–2.2)
Albumin: 4.6 g/dL (ref 3.8–4.8)
Alkaline Phosphatase: 76 IU/L (ref 44–121)
BUN/Creatinine Ratio: 16 (ref 9–23)
BUN: 10 mg/dL (ref 6–20)
Bilirubin Total: 0.8 mg/dL (ref 0.0–1.2)
CO2: 23 mmol/L (ref 20–29)
Calcium: 9.7 mg/dL (ref 8.7–10.2)
Chloride: 102 mmol/L (ref 96–106)
Creatinine, Ser: 0.63 mg/dL (ref 0.57–1.00)
GFR calc Af Amer: 131 mL/min/{1.73_m2} (ref >59)
GFR calc non Af Amer: 113 mL/min/{1.73_m2} (ref >59)
Globulin, Total: 3.3 g/dL (ref 1.5–4.5)
Glucose: 99 mg/dL (ref 65–99)
Potassium: 4 mmol/L (ref 3.5–5.2)
Sodium: 137 mmol/L (ref 134–144)
Total Protein: 7.9 g/dL (ref 6.0–8.5)

## 2020-06-13 MED FILL — METFORMIN HCL 1000 MG TABS: 1000 | 30 days supply | Qty: 60 | Fill #1

## 2020-07-23 MED FILL — METFORMIN HCL 1000 MG TABS: 1000 | 30 days supply | Qty: 60 | Fill #2
